# Patient Record
Sex: Female | Born: 1955 | Race: White | Hispanic: No | Smoking: Never smoker
Health system: Southern US, Community
[De-identification: ages and names within clinical notes are randomized; demographics above are authoritative.]

## PROBLEM LIST (undated history)

## (undated) DIAGNOSIS — I10 Essential (primary) hypertension: Secondary | ICD-10-CM

## (undated) DIAGNOSIS — F209 Schizophrenia, unspecified: Secondary | ICD-10-CM

## (undated) DIAGNOSIS — K219 Gastro-esophageal reflux disease without esophagitis: Secondary | ICD-10-CM

---

## 2000-07-05 HISTORY — PX: BREAST BIOPSY: SHX20

## 2004-08-17 ENCOUNTER — Ambulatory Visit: Payer: Self-pay | Admitting: Internal Medicine

## 2004-10-23 ENCOUNTER — Ambulatory Visit: Payer: Self-pay | Admitting: Gastroenterology

## 2005-08-31 ENCOUNTER — Ambulatory Visit: Payer: Self-pay | Admitting: Internal Medicine

## 2006-09-05 ENCOUNTER — Ambulatory Visit: Payer: Self-pay | Admitting: Internal Medicine

## 2007-05-22 ENCOUNTER — Ambulatory Visit: Payer: Self-pay | Admitting: Physician Assistant

## 2007-05-23 ENCOUNTER — Inpatient Hospital Stay: Payer: Self-pay | Admitting: Gastroenterology

## 2007-10-20 ENCOUNTER — Ambulatory Visit: Payer: Self-pay | Admitting: Internal Medicine

## 2008-10-22 ENCOUNTER — Ambulatory Visit: Payer: Self-pay | Admitting: Internal Medicine

## 2009-11-12 ENCOUNTER — Ambulatory Visit: Payer: Self-pay | Admitting: Internal Medicine

## 2010-11-17 ENCOUNTER — Ambulatory Visit: Payer: Self-pay | Admitting: Internal Medicine

## 2011-03-23 ENCOUNTER — Ambulatory Visit: Payer: Self-pay | Admitting: Internal Medicine

## 2011-11-23 ENCOUNTER — Ambulatory Visit: Payer: Self-pay | Admitting: Internal Medicine

## 2012-11-23 ENCOUNTER — Ambulatory Visit: Payer: Self-pay | Admitting: Internal Medicine

## 2013-12-21 ENCOUNTER — Ambulatory Visit: Payer: Self-pay | Admitting: Internal Medicine

## 2014-10-18 ENCOUNTER — Other Ambulatory Visit: Payer: Self-pay | Admitting: Internal Medicine

## 2014-10-18 DIAGNOSIS — Z1231 Encounter for screening mammogram for malignant neoplasm of breast: Secondary | ICD-10-CM

## 2014-12-31 ENCOUNTER — Ambulatory Visit: Payer: Self-pay

## 2015-01-13 ENCOUNTER — Other Ambulatory Visit: Payer: Self-pay | Admitting: Physician Assistant

## 2015-01-13 ENCOUNTER — Ambulatory Visit
Admission: RE | Admit: 2015-01-13 | Discharge: 2015-01-13 | Disposition: A | Discharge status: Home / Self Care | Payer: Managed Care, Other (non HMO) | Source: Ambulatory Visit | Attending: Physician Assistant | Admitting: Physician Assistant

## 2015-01-13 DIAGNOSIS — R4781 Slurred speech: Secondary | ICD-10-CM

## 2015-01-13 DIAGNOSIS — R413 Other amnesia: Secondary | ICD-10-CM

## 2015-01-30 ENCOUNTER — Ambulatory Visit
Admission: RE | Admit: 2015-01-30 | Discharge: 2015-01-30 | Disposition: A | Discharge status: Home / Self Care | Payer: Managed Care, Other (non HMO) | Source: Ambulatory Visit | Attending: Internal Medicine | Admitting: Internal Medicine

## 2015-01-30 DIAGNOSIS — Z1231 Encounter for screening mammogram for malignant neoplasm of breast: Secondary | ICD-10-CM | POA: Diagnosis not present

## 2015-02-20 DIAGNOSIS — Z1211 Encounter for screening for malignant neoplasm of colon: Secondary | ICD-10-CM | POA: Diagnosis not present

## 2015-02-20 DIAGNOSIS — F209 Schizophrenia, unspecified: Secondary | ICD-10-CM | POA: Diagnosis not present

## 2015-02-20 DIAGNOSIS — I10 Essential (primary) hypertension: Secondary | ICD-10-CM | POA: Diagnosis not present

## 2015-02-20 DIAGNOSIS — D124 Benign neoplasm of descending colon: Secondary | ICD-10-CM | POA: Diagnosis not present

## 2015-02-20 DIAGNOSIS — Z79899 Other long term (current) drug therapy: Secondary | ICD-10-CM | POA: Diagnosis not present

## 2015-02-20 DIAGNOSIS — K219 Gastro-esophageal reflux disease without esophagitis: Secondary | ICD-10-CM | POA: Diagnosis not present

## 2015-02-20 DIAGNOSIS — Z6833 Body mass index (BMI) 33.0-33.9, adult: Secondary | ICD-10-CM | POA: Diagnosis not present

## 2015-02-20 DIAGNOSIS — E669 Obesity, unspecified: Secondary | ICD-10-CM | POA: Diagnosis not present

## 2015-02-20 NOTE — Anesthesia Preprocedure Evaluation (Addendum)
Anesthesia Evaluation  Patient identified by MRN, date of birth, ID band Patient awake    Reviewed: Allergy & Precautions, H&P , NPO status , Patient's Chart, lab work & pertinent test results  History of Anesthesia Complications (+) DIFFICULT AIRWAY  Airway Mallampati: II  TM Distance: >3 FB Neck ROM: full    Dental no notable dental hx. (+) Teeth Intact   Pulmonary neg pulmonary ROS,  breath sounds clear to auscultation  Pulmonary exam normal       Cardiovascular hypertension, negative cardio ROS Normal cardiovascular examRhythm:regular Rate:Normal     Neuro/Psych negative neurological ROS  negative psych ROS   GI/Hepatic negative GI ROS, Neg liver ROS, GERD-  Controlled,  Endo/Other  negative endocrine ROS  Renal/GU negative Renal ROS  negative genitourinary   Musculoskeletal negative musculoskeletal ROS (+)   Abdominal Normal abdominal exam  (+) + obese,   Peds negative pediatric ROS (+)  Hematology negative hematology ROS (+)   Anesthesia Other Findings No past medical history on file.   Reproductive/Obstetrics negative OB ROS                            Anesthesia Physical Anesthesia Plan  ASA: II  Anesthesia Plan: General   Post-op Pain Management:    Induction: Intravenous  Airway Management Planned: Nasal Cannula  Additional Equipment:   Intra-op Plan:   Post-operative Plan:   Informed Consent: I have reviewed the patients History and Physical, chart, labs and discussed the procedure including the risks, benefits and alternatives for the proposed anesthesia with the patient or authorized representative who has indicated his/her understanding and acceptance.   Dental Advisory Given  Plan Discussed with: Anesthesiologist, CRNA and Surgeon  Anesthesia Plan Comments:        Anesthesia Quick Evaluation

## 2015-02-21 ENCOUNTER — Ambulatory Visit: Payer: Managed Care, Other (non HMO) | Admitting: Anesthesiology

## 2015-02-21 ENCOUNTER — Encounter
Admission: RE | Disposition: A | Discharge status: Home / Self Care | Payer: Self-pay | Source: Ambulatory Visit | Attending: Gastroenterology

## 2015-02-21 ENCOUNTER — Ambulatory Visit
Admission: RE | Admit: 2015-02-21 | Discharge: 2015-02-21 | Disposition: A | Discharge status: Home / Self Care | Payer: Managed Care, Other (non HMO) | Source: Ambulatory Visit | Attending: Gastroenterology | Admitting: Gastroenterology

## 2015-02-21 ENCOUNTER — Encounter: Payer: Self-pay | Admitting: *Deleted

## 2015-02-21 DIAGNOSIS — E669 Obesity, unspecified: Secondary | ICD-10-CM | POA: Insufficient documentation

## 2015-02-21 DIAGNOSIS — D124 Benign neoplasm of descending colon: Secondary | ICD-10-CM | POA: Insufficient documentation

## 2015-02-21 DIAGNOSIS — Z6833 Body mass index (BMI) 33.0-33.9, adult: Secondary | ICD-10-CM | POA: Insufficient documentation

## 2015-02-21 DIAGNOSIS — Z1211 Encounter for screening for malignant neoplasm of colon: Secondary | ICD-10-CM | POA: Diagnosis not present

## 2015-02-21 DIAGNOSIS — Z79899 Other long term (current) drug therapy: Secondary | ICD-10-CM | POA: Insufficient documentation

## 2015-02-21 DIAGNOSIS — I10 Essential (primary) hypertension: Secondary | ICD-10-CM | POA: Insufficient documentation

## 2015-02-21 DIAGNOSIS — K219 Gastro-esophageal reflux disease without esophagitis: Secondary | ICD-10-CM | POA: Insufficient documentation

## 2015-02-21 DIAGNOSIS — F209 Schizophrenia, unspecified: Secondary | ICD-10-CM | POA: Insufficient documentation

## 2015-02-21 HISTORY — PX: COLONOSCOPY: SHX5424

## 2015-02-21 HISTORY — DX: Schizophrenia, unspecified: F20.9

## 2015-02-21 HISTORY — DX: Essential (primary) hypertension: I10

## 2015-02-21 SURGERY — COLONOSCOPY
Anesthesia: General

## 2015-02-21 MED ORDER — SODIUM CHLORIDE 0.9 % IV SOLN: INTRAVENOUS | Status: DC

## 2015-02-21 MED ORDER — MIDAZOLAM HCL 2 MG/2ML IJ SOLN
INTRAMUSCULAR | Status: DC | PRN
  Administered 2015-02-21: 1 mg via INTRAVENOUS

## 2015-02-21 MED ORDER — FENTANYL CITRATE (PF) 100 MCG/2ML IJ SOLN
INTRAMUSCULAR | Status: DC | PRN
  Administered 2015-02-21: 50 ug via INTRAVENOUS

## 2015-02-21 MED ORDER — PROPOFOL INFUSION 10 MG/ML OPTIME
INTRAVENOUS | Status: DC | PRN
  Administered 2015-02-21: 140 ug/kg/min via INTRAVENOUS

## 2015-02-21 MED ORDER — SODIUM CHLORIDE 0.9 % IV SOLN
INTRAVENOUS | Status: DC
  Administered 2015-02-21: 1000 mL via INTRAVENOUS

## 2015-02-21 MED ORDER — METOPROLOL SUCCINATE ER 50 MG PO TB24
50.0000 mg | ORAL_TABLET | Freq: Once | ORAL | Status: AC
  Administered 2015-02-21: 50 mg via ORAL
  Filled 2015-02-21: qty 1

## 2015-02-21 NOTE — H&P (Signed)
  Primary Care Physician:  Tracie Harrier, MD  Pre-Procedure History & Physical: HPI:  Paula Reid is a 59 y.o. female is here for an colonoscopy.   Past Medical History  Diagnosis Date  . Hypertension   . Schizophrenia     Past Surgical History  Procedure Laterality Date  . Breast biopsy Left 2002    Negative c Clip    Prior to Admission medications   Medication Sig Start Date End Date Taking? Authorizing Provider  benztropine (COGENTIN) 0.5 MG tablet Take 0.5 mg by mouth 2 (two) times daily.   Yes Historical Provider, MD  cloZAPine (CLOZARIL) 100 MG tablet Take 100 mg by mouth daily.   Yes Historical Provider, MD  ergocalciferol (VITAMIN D2) 50000 UNITS capsule Take 50,000 Units by mouth once a week.   Yes Historical Provider, MD  glycopyrrolate (ROBINUL) 2 MG tablet Take 2 mg by mouth 3 (three) times daily.   Yes Historical Provider, MD  metoprolol succinate (TOPROL-XL) 50 MG 24 hr tablet Take 50 mg by mouth daily. Take with or immediately following a meal.   Yes Historical Provider, MD  omeprazole (PRILOSEC) 20 MG capsule Take 20 mg by mouth daily.   Yes Historical Provider, MD  vitamin B-12 (CYANOCOBALAMIN) 500 MCG tablet Take 500 mcg by mouth daily.   Yes Historical Provider, MD    Allergies as of 02/20/2015 - never reviewed  Allergen Reaction Noted  . Codeine  02/20/2015    Family History  Problem Relation Age of Onset  . Breast cancer Sister   . Breast cancer Paternal Grandmother     Social History   Social History  . Marital Status: Single    Spouse Name: N/A  . Number of Children: N/A  . Years of Education: N/A   Occupational History  . Not on file.   Social History Main Topics  . Smoking status: Not on file  . Smokeless tobacco: Not on file  . Alcohol Use: Not on file  . Drug Use: Not on file  . Sexual Activity: Not on file   Other Topics Concern  . Not on file   Social History Narrative     Physical Exam: BP 149/80 mmHg  Pulse 112   Temp(Src) 96.5 F (35.8 C) (Tympanic)  Resp 24  Ht 5\' 6"  (1.676 m)  Wt 95.255 kg (210 lb)  BMI 33.91 kg/m2  SpO2 98% General:   Alert,  pleasant and cooperative in NAD Head:  Normocephalic and atraumatic. Neck:  Supple; no masses or thyromegaly. Lungs:  Clear throughout to auscultation.    Heart:  Regular rate and rhythm. Abdomen:  Soft, nontender and nondistended. Normal bowel sounds, without guarding, and without rebound.   Neurologic:  Alert and  oriented x4;  grossly normal neurologically.  Impression/Plan: Paula Reid is here for an colonoscopy to be performed for screening  Risks, benefits, limitations, and alternatives regarding  colonoscopy have been reviewed with the patient.  Questions have been answered.  All parties agreeable.   Josefine Class, MD  02/21/2015, 11:52 AM

## 2015-02-21 NOTE — Op Note (Signed)
[facility] Patient Name: Paula Reid Procedure Date: 02/21/2015 11:56 AM MRN: [id number] Account #: [id number] Date of Birth: January 04, 1956 Admit Type: Outpatient Age: 59 Room: [facility] ENDO ROOM 3 Gender: Female Note Status: Finalized Procedure:         Colonoscopy Indications:       Screening for colorectal malignant neoplasm, Last                     colonoscopy 10 years ago Patient Profile:   This is a 59 year old female. Providers:         Gerrit Heck. Rayann Heman, MD Referring MD:      Tracie Harrier, MD (Referring MD) Medicines:         Propofol per Anesthesia Complications:     No immediate complications. Procedure:         Pre-Anesthesia Assessment:                    - Prior to the procedure, a History and Physical was                     performed, and patient medications, allergies and                     sensitivities were reviewed. The patient's tolerance of                     previous anesthesia was reviewed.                    After obtaining informed consent, the colonoscope was                     passed under direct vision. Throughout the procedure, the                     patient's blood pressure, pulse, and oxygen saturations                     were monitored continuously. The Colonoscope was                     introduced through the anus and advanced to the the cecum,                     identified by appendiceal orifice and ileocecal valve. The                     colonoscopy was performed without difficulty. The patient                     tolerated the procedure well. The quality of the bowel                     preparation was excellent. Findings:      The perianal and digital rectal examinations were normal.      A 4 mm polyp was found in the descending colon. The polyp was sessile.       The polyp was removed with a cold snare. Resection and retrieval were       complete.      The exam was otherwise without abnormality on  direct and retroflexion       views. Impression:        - One 4 mm polyp in the descending colon.  Resected and                     retrieved.                    - The examination was otherwise normal on direct and                     retroflexion views. Recommendation:    - Observe patient in GI recovery unit.                    - High fiber diet.                    - Continue present medications.                    - Await pathology results.                    - Repeat colonoscopy for surveillance based on pathology                     results.                    - Return to referring physician.                    - The findings and recommendations were discussed with the                     patient.                    - The findings and recommendations were discussed with the                     patient's family. Procedure Code(s): --- Professional ---                    [phone], Colonoscopy, flexible; with removal of tumor(s),                     polyp(s), or other lesion(s) by snare technique CPT copyright 2014 [employer]. All rights reserved. The codes documented in this report are preliminary and upon coder review may  be revised to meet current compliance requirements. Mellody Life, MD 02/21/2015 12:26:56 PM This report has been signed electronically. Number of Addenda: 0 Note Initiated On: 02/21/2015 11:56 AM Scope Withdrawal Time: 0 hours 12 minutes 53 seconds  Total Procedure Duration: 0 hours 18 minutes 24 seconds       [facility]

## 2015-02-21 NOTE — Discharge Instructions (Signed)

## 2015-02-21 NOTE — Anesthesia Postprocedure Evaluation (Signed)
  Anesthesia Post-op Note  Patient: Paula Reid  Procedure(s) Performed: Procedure(s): COLONOSCOPY (N/A)  Anesthesia type:General  Patient location: PACU  Post pain: Pain level controlled  Post assessment: Post-op Vital signs reviewed, Patient's Cardiovascular Status Stable, Respiratory Function Stable, Patent Airway and No signs of Nausea or vomiting  Post vital signs: Reviewed and stable  Last Vitals:  Filed Vitals:   02/21/15 1233  BP: 108/76  Pulse: 79  Temp: 35.1 C  Resp: 16    Level of consciousness: awake, alert  and patient cooperative  Complications: No apparent anesthesia complications

## 2015-02-21 NOTE — Transfer of Care (Signed)
Immediate Anesthesia Transfer of Care Note  Patient: Paula Reid  Procedure(s) Performed: Procedure(s): COLONOSCOPY (N/A)  Patient Location: PACU and Endoscopy Unit  Anesthesia Type:General  Level of Consciousness: awake, alert  and oriented  Airway & Oxygen Therapy: Patient Spontanous Breathing  Post-op Assessment: Report given to RN and Post -op Vital signs reviewed and stable  Post vital signs: stable  Last Vitals:  Filed Vitals:   02/21/15 1233  BP: 108/76  Pulse: 79  Temp: 35.1 C  Resp: 16    Complications: No apparent anesthesia complications

## 2015-02-22 NOTE — Progress Notes (Signed)
Non-identifying voicemail.  No message left.

## 2015-02-24 ENCOUNTER — Encounter: Payer: Self-pay | Admitting: Gastroenterology

## 2015-02-24 LAB — SURGICAL PATHOLOGY

## 2016-01-23 ENCOUNTER — Other Ambulatory Visit: Payer: Self-pay | Admitting: Internal Medicine

## 2016-01-23 DIAGNOSIS — Z1231 Encounter for screening mammogram for malignant neoplasm of breast: Secondary | ICD-10-CM

## 2016-02-11 ENCOUNTER — Ambulatory Visit: Payer: Managed Care, Other (non HMO)

## 2016-02-20 ENCOUNTER — Ambulatory Visit: Payer: Managed Care, Other (non HMO)

## 2016-02-27 ENCOUNTER — Other Ambulatory Visit: Payer: Self-pay | Admitting: Internal Medicine

## 2016-02-27 ENCOUNTER — Ambulatory Visit
Admission: RE | Admit: 2016-02-27 | Discharge: 2016-02-27 | Disposition: A | Discharge status: Home / Self Care | Payer: Managed Care, Other (non HMO) | Source: Ambulatory Visit | Attending: Internal Medicine | Admitting: Internal Medicine

## 2016-02-27 DIAGNOSIS — R928 Other abnormal and inconclusive findings on diagnostic imaging of breast: Secondary | ICD-10-CM | POA: Diagnosis not present

## 2016-02-27 DIAGNOSIS — Z1231 Encounter for screening mammogram for malignant neoplasm of breast: Secondary | ICD-10-CM | POA: Insufficient documentation

## 2016-02-27 IMAGING — MG MM DIGITAL SCREENING BILAT W/ TOMO W/ CAD
4 series · 4 of 4 positions shown · non-contrast
Comparison: Previous exam(s).

CLINICAL DATA: Screening.

EXAM:
2D DIGITAL SCREENING BILATERAL MAMMOGRAM WITH CAD AND ADJUNCT TOMO

[L MLO (1 of 2)]
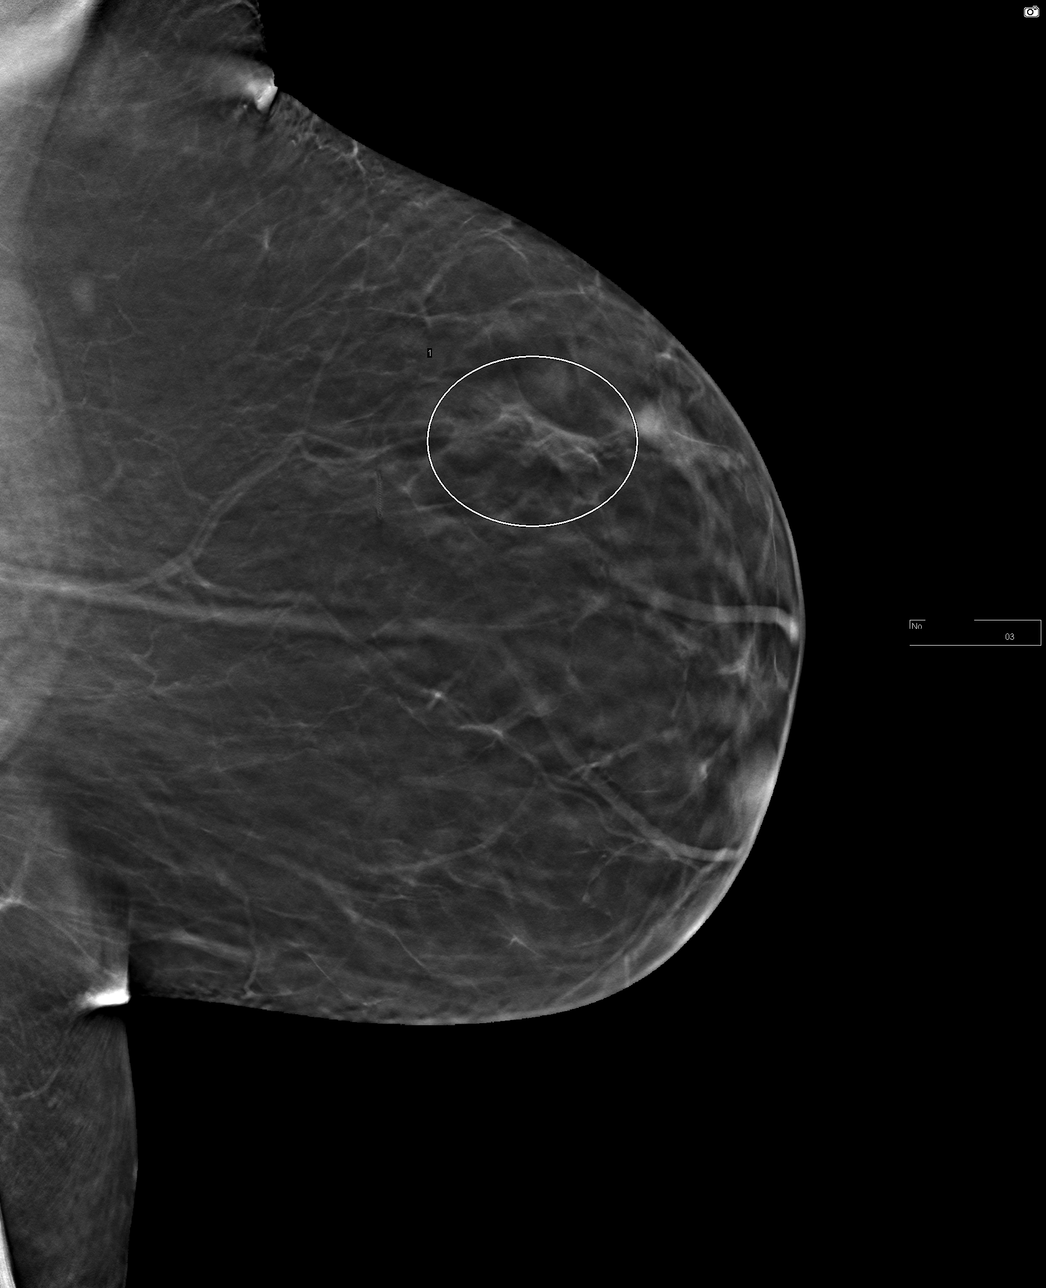

[L MLO (2 of 2)]
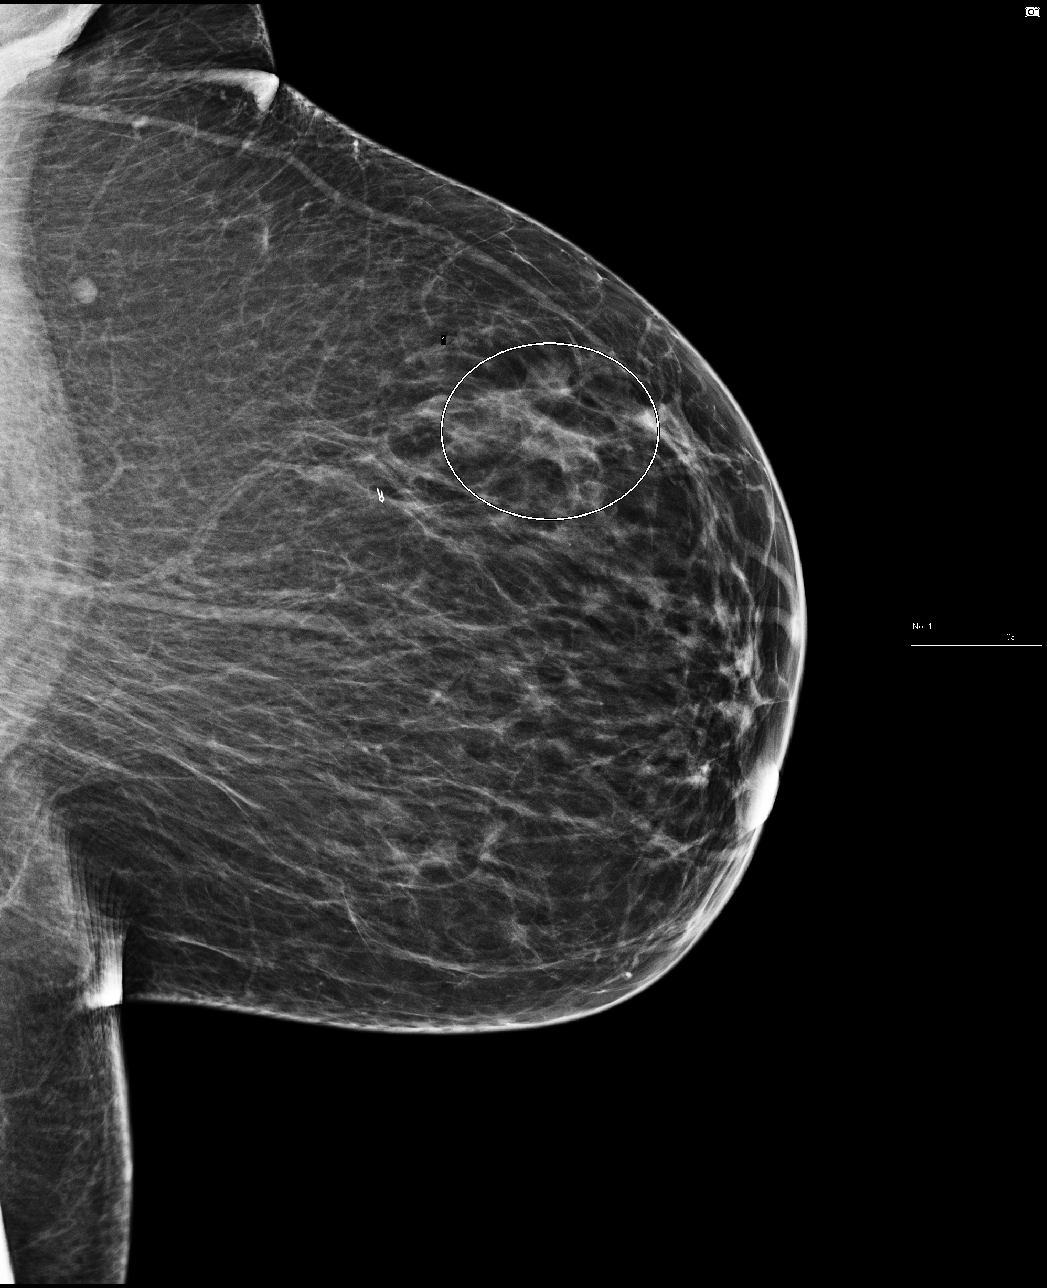

[L CC (1 of 2)]
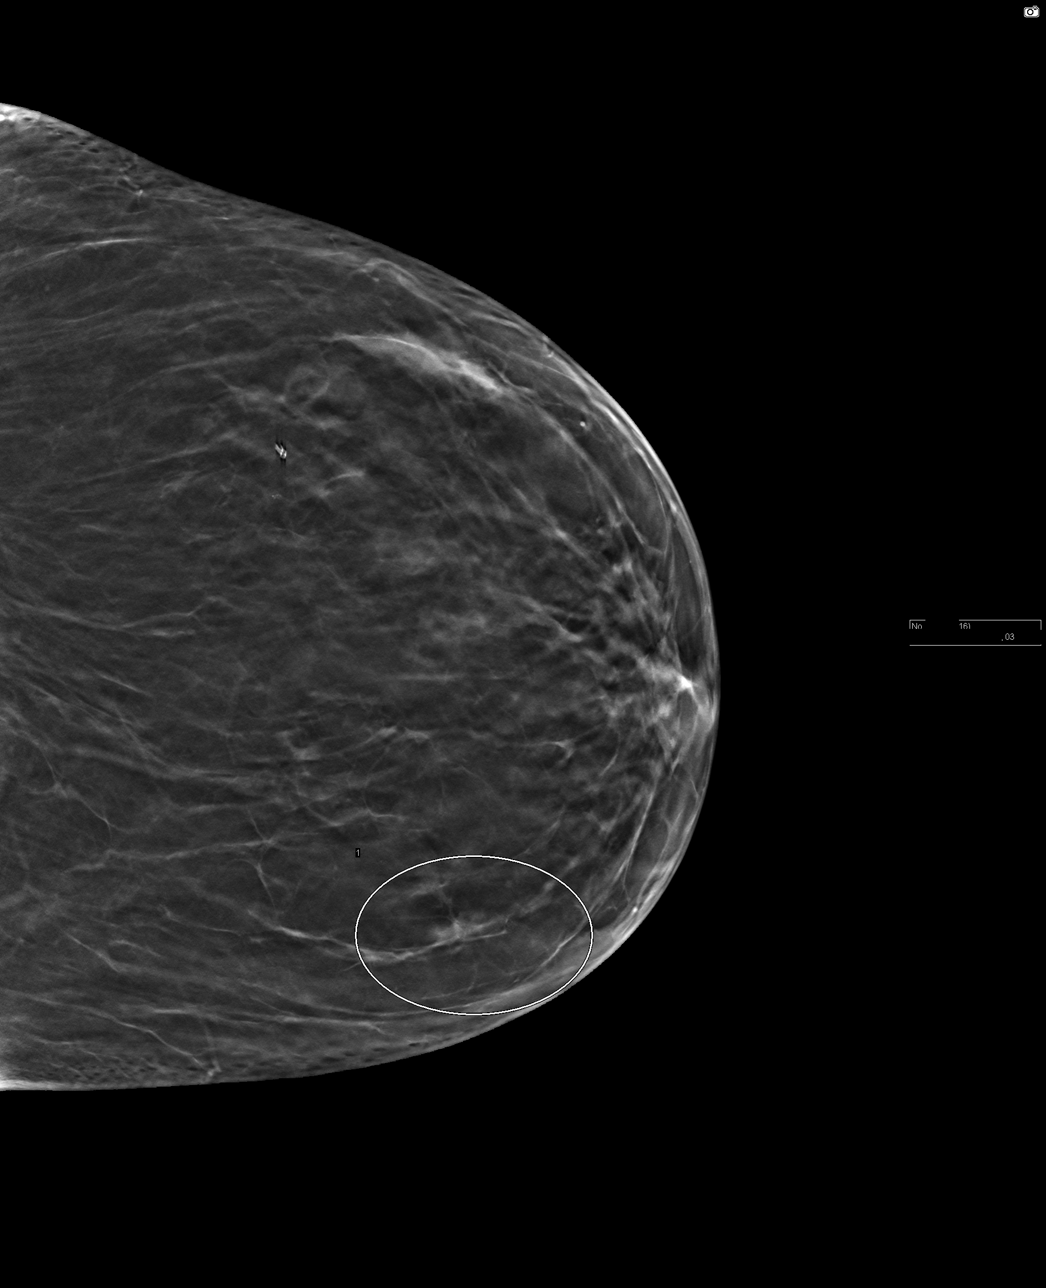

[L CC (2 of 2)]
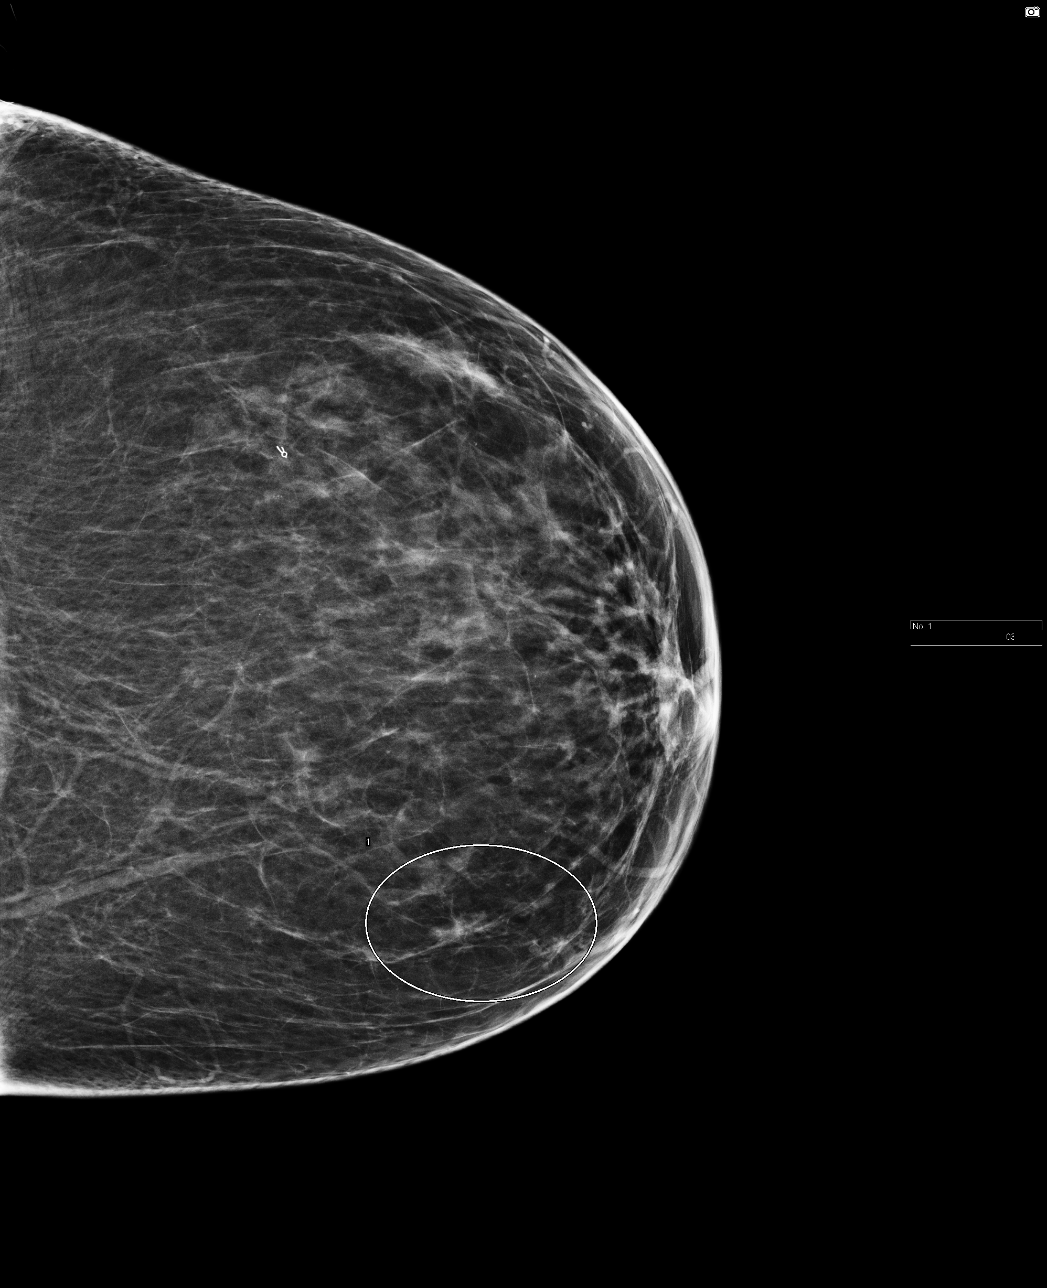

[4 of 4 positions shown; findings below may reference images not displayed]

ACR Breast Density Category b: There are scattered areas of
fibroglandular density.
FINDINGS: In the left breast, a possible mass warrants further evaluation. In
the right breast, no findings suspicious for malignancy.

Images were processed with CAD.
IMPRESSION: Further evaluation is suggested for possible mass in the left
breast.

RECOMMENDATION:
Diagnostic mammogram and possibly ultrasound of the left breast.
(Code:KJ-[id number])

The patient will be contacted regarding the findings, and additional
imaging will be scheduled.

BI-RADS CATEGORY  0: Incomplete. Need additional imaging evaluation
and/or prior mammograms for comparison.

## 2016-03-02 ENCOUNTER — Other Ambulatory Visit: Payer: Self-pay | Admitting: Internal Medicine

## 2016-03-02 DIAGNOSIS — N6320 Unspecified lump in the left breast, unspecified quadrant: Secondary | ICD-10-CM

## 2016-03-04 ENCOUNTER — Ambulatory Visit
Admission: RE | Admit: 2016-03-04 | Discharge: 2016-03-04 | Disposition: A | Discharge status: Home / Self Care | Payer: Managed Care, Other (non HMO) | Source: Ambulatory Visit | Attending: Internal Medicine | Admitting: Internal Medicine

## 2016-03-04 DIAGNOSIS — N6320 Unspecified lump in the left breast, unspecified quadrant: Secondary | ICD-10-CM

## 2016-03-04 DIAGNOSIS — N63 Unspecified lump in breast: Secondary | ICD-10-CM | POA: Diagnosis present

## 2016-03-18 ENCOUNTER — Other Ambulatory Visit: Payer: Managed Care, Other (non HMO)

## 2016-03-18 ENCOUNTER — Ambulatory Visit: Payer: Managed Care, Other (non HMO)

## 2016-07-28 ENCOUNTER — Emergency Department: Payer: Managed Care, Other (non HMO)

## 2016-07-28 ENCOUNTER — Inpatient Hospital Stay
Admission: EM | Admit: 2016-07-28 | Discharge: 2016-07-29 | DRG: 689 | Disposition: A | Discharge status: Home / Self Care | Payer: Managed Care, Other (non HMO) | Attending: Internal Medicine | Admitting: Internal Medicine

## 2016-07-28 DIAGNOSIS — K219 Gastro-esophageal reflux disease without esophagitis: Secondary | ICD-10-CM | POA: Diagnosis present

## 2016-07-28 DIAGNOSIS — I10 Essential (primary) hypertension: Secondary | ICD-10-CM | POA: Diagnosis present

## 2016-07-28 DIAGNOSIS — G9340 Encephalopathy, unspecified: Secondary | ICD-10-CM | POA: Diagnosis present

## 2016-07-28 DIAGNOSIS — R4701 Aphasia: Secondary | ICD-10-CM | POA: Diagnosis present

## 2016-07-28 DIAGNOSIS — I639 Cerebral infarction, unspecified: Secondary | ICD-10-CM

## 2016-07-28 DIAGNOSIS — N3000 Acute cystitis without hematuria: Secondary | ICD-10-CM | POA: Diagnosis present

## 2016-07-28 DIAGNOSIS — N390 Urinary tract infection, site not specified: Secondary | ICD-10-CM | POA: Diagnosis present

## 2016-07-28 DIAGNOSIS — Z79899 Other long term (current) drug therapy: Secondary | ICD-10-CM | POA: Diagnosis not present

## 2016-07-28 DIAGNOSIS — R410 Disorientation, unspecified: Secondary | ICD-10-CM | POA: Diagnosis present

## 2016-07-28 DIAGNOSIS — F200 Paranoid schizophrenia: Secondary | ICD-10-CM | POA: Diagnosis present

## 2016-07-28 DIAGNOSIS — F209 Schizophrenia, unspecified: Secondary | ICD-10-CM | POA: Diagnosis present

## 2016-07-28 DIAGNOSIS — F17200 Nicotine dependence, unspecified, uncomplicated: Secondary | ICD-10-CM | POA: Diagnosis present

## 2016-07-28 HISTORY — DX: Gastro-esophageal reflux disease without esophagitis: K21.9

## 2016-07-28 LAB — CBC
HEMATOCRIT: 38.8 % (ref 35.0–47.0)
HEMOGLOBIN: 13.8 g/dL (ref 12.0–16.0)
MCH: 29.9 pg (ref 26.0–34.0)
MCHC: 35.6 g/dL (ref 32.0–36.0)
MCV: 84.2 fL (ref 80.0–100.0)
Platelets: 171 10*3/uL (ref 150–440)
RBC: 4.61 MIL/uL (ref 3.80–5.20)
RDW: 13.5 % (ref 11.5–14.5)
WBC: 7 10*3/uL (ref 3.6–11.0)

## 2016-07-28 LAB — URINALYSIS, COMPLETE (UACMP) WITH MICROSCOPIC
Bilirubin Urine: NEGATIVE
GLUCOSE, UA: NEGATIVE mg/dL
HGB URINE DIPSTICK: NEGATIVE
Ketones, ur: 20 mg/dL — AB
NITRITE: NEGATIVE
PH: 5 (ref 5.0–8.0)
Specific Gravity, Urine: 1.019 (ref 1.005–1.030)

## 2016-07-28 LAB — ETHANOL: Alcohol, Ethyl (B): <5 mg/dL (ref <5)

## 2016-07-28 LAB — COMPREHENSIVE METABOLIC PANEL
ALBUMIN: 4.2 g/dL (ref 3.5–5.0)
ALK PHOS: 46 U/L (ref 38–126)
ALT: 14 U/L (ref 14–54)
ANION GAP: 9 (ref 5–15)
AST: 21 U/L (ref 15–41)
BUN: 16 mg/dL (ref 6–20)
CALCIUM: 8.9 mg/dL (ref 8.9–10.3)
CHLORIDE: 105 mmol/L (ref 101–111)
CO2: 23 mmol/L (ref 22–32)
Creatinine, Ser: 0.83 mg/dL (ref 0.44–1.00)
GFR calc non Af Amer: >60 mL/min (ref >60)
GLUCOSE: 83 mg/dL (ref 65–99)
POTASSIUM: 4.1 mmol/L (ref 3.5–5.1)
SODIUM: 137 mmol/L (ref 135–145)
Total Bilirubin: 1.1 mg/dL (ref 0.3–1.2)
Total Protein: 6.8 g/dL (ref 6.5–8.1)

## 2016-07-28 LAB — GLUCOSE, CAPILLARY: GLUCOSE-CAPILLARY: 78 mg/dL (ref 65–99)

## 2016-07-28 LAB — TSH: TSH: 1.731 u[IU]/mL (ref 0.350–4.500)

## 2016-07-28 MED ORDER — CEFTRIAXONE SODIUM-DEXTROSE 1-3.74 GM-% IV SOLR
1.0000 g | Freq: Once | INTRAVENOUS | Status: AC
  Administered 2016-07-29: 03:00:00 1 g via INTRAVENOUS
  Filled 2016-07-28: qty 50

## 2016-07-28 MED ORDER — CEPHALEXIN 500 MG PO CAPS: 500.0000 mg | ORAL_CAPSULE | Freq: Four times a day (QID) | ORAL | 0 refills | Status: DC

## 2016-07-28 MED ORDER — DEXTROSE 5 % IV SOLN: 1.0000 g | Freq: Once | INTRAVENOUS | Status: DC

## 2016-07-28 NOTE — ED Notes (Signed)
Pt taken to CT via stretcher.

## 2016-07-28 NOTE — Discharge Instructions (Signed)
Suspect that sure confusion today is related to your psychiatric medications. As we discussed with your psychiatrist, please stop use of benztropine and clozaril until you have been reevaluated by her psychiatrist on Friday.  Please return to the emergency room if the patient begins to have a fever, trouble breathing or chest pain, weakness, nausea and vomiting, worsening confusion, hallucinations, or other new concerns arise.

## 2016-07-28 NOTE — H&P (Signed)
Paula Reid at [place] NAME: Paula Reid    MR#:  [id number]  DATE OF BIRTH:  [birth year]  DATE OF ADMISSION:  07/28/2016  PRIMARY CARE PHYSICIAN: Tracie Harrier, MD   REQUESTING/REFERRING PHYSICIAN: Mariea Clonts, MD  CHIEF COMPLAINT:   Chief Complaint  Patient presents with  . Altered Mental Status    HISTORY OF PRESENT ILLNESS:  Paula Reid  is a 61 y.o. female who presents with Several days of progressive confusion. Patient is a paranoid schizophrenic, who has been stable and high functioning on medication for a long time. She was having episodes of confusion at work for the past couple of days, and today did not show up for work. Her supervisor called her brother, who had a conversation with her primary psychiatrist. Her psychiatrist spoke with the patient over the phone, and then recommended she be brought to the ED for evaluation. There is some suspicion of potential medication confusion given that when her brother went to pick her up from her house her medications were laid out, but not in an orderly manner as they usually are. Here the patient was found to also have a UTI. Hospitals were called for admission  PAST MEDICAL HISTORY:   Past Medical History:  Diagnosis Date  . GERD (gastroesophageal reflux disease)   . Hypertension   . Schizophrenia ([place])     PAST SURGICAL HISTORY:   Past Surgical History:  Procedure Laterality Date  . BREAST BIOPSY Left 2002   Negative c Clip  . COLONOSCOPY N/A 02/21/2015   Procedure: COLONOSCOPY;  Surgeon: Josefine Class, MD;  Location: [facility] ENDOSCOPY;  Service: Endoscopy;  Laterality: N/A;    SOCIAL HISTORY:   Social History  Substance Use Topics  . Smoking status: Current Every Day Smoker  . Smokeless tobacco: Never Used  . Alcohol use Yes    FAMILY HISTORY:   Family History  Problem Relation Age of Onset  . Breast cancer Sister 72  . Breast cancer Paternal Grandmother      DRUG ALLERGIES:   Allergies  Allergen Reactions  . Codeine     MEDICATIONS AT HOME:   Prior to Admission medications   Medication Sig Start Date End Date Taking? Authorizing Provider  benztropine (COGENTIN) 0.5 MG tablet Take 0.5 mg by mouth 3 (three) times daily as needed.    Yes Historical Provider, MD  cloZAPine (CLOZARIL) 25 MG tablet Take 525 mg by mouth at bedtime.    Yes Historical Provider, MD  glycopyrrolate (ROBINUL) 2 MG tablet Take 8 mg by mouth at bedtime.    Yes Historical Provider, MD  metoprolol succinate (TOPROL-XL) 25 MG 24 hr tablet Take 25 mg by mouth daily. Take with or immediately following a meal.    Yes Historical Provider, MD  pyridOXINE (VITAMIN B-6) 100 MG tablet Take 100 mg by mouth daily.   Yes Historical Provider, MD  cephALEXin (KEFLEX) 500 MG capsule Take 1 capsule (500 mg total) by mouth 4 (four) times daily. 07/28/16 08/07/16  Eula Listen, MD  ergocalciferol (VITAMIN D2) 50000 UNITS capsule Take 50,000 Units by mouth once a week.    Historical Provider, MD  omeprazole (PRILOSEC) 20 MG capsule Take 20 mg by mouth daily.    Historical Provider, MD    REVIEW OF SYSTEMS:  Review of Systems  Constitutional: Negative for chills, fever, malaise/fatigue and weight loss.  HENT: Negative for ear pain, hearing loss and tinnitus.   Eyes: Negative for blurred vision, double  vision, pain and redness.  Respiratory: Negative for cough, hemoptysis and shortness of breath.   Cardiovascular: Negative for chest pain, palpitations, orthopnea and leg swelling.  Gastrointestinal: Negative for abdominal pain, constipation, diarrhea, nausea and vomiting.  Genitourinary: Positive for frequency. Negative for dysuria and hematuria.  Musculoskeletal: Negative for back pain, joint pain and neck pain.  Skin:       No acne, rash, or lesions  Neurological: Negative for dizziness, tremors, focal weakness and weakness.       Confusion  Endo/Heme/Allergies: Negative for  polydipsia. Does not bruise/bleed easily.  Psychiatric/Behavioral: Negative for depression. The patient is not nervous/anxious and does not have insomnia.      VITAL SIGNS:   Vitals:   07/28/16 2000 07/28/16 2030 07/28/16 2100 07/28/16 2243  BP: 133/80 (!) 141/85 140/81 122/76  Pulse: 79 80 87 78  Resp: 16 (!) 23 16 20   Temp:      TempSrc:      SpO2: 97% 97% 96% 97%  Weight:       Wt Readings from Last 3 Encounters:  07/28/16 95.3 kg (210 lb)  02/21/15 95.3 kg (210 lb)    PHYSICAL EXAMINATION:  Physical Exam  Vitals reviewed. Constitutional: She appears well-developed and well-nourished. No distress.  HENT:  Head: Normocephalic and atraumatic.  Mouth/Throat: Oropharynx is clear and moist.  Eyes: Conjunctivae and EOM are normal. Pupils are equal, round, and reactive to light. No scleral icterus.  Neck: Normal range of motion. Neck supple. No JVD present. No thyromegaly present.  Cardiovascular: Normal rate, regular rhythm and intact distal pulses.  Exam reveals no gallop and no friction rub.   No murmur heard. Respiratory: Effort normal and breath sounds normal. No respiratory distress. She has no wheezes. She has no rales.  GI: Soft. Bowel sounds are normal. She exhibits no distension. There is no tenderness.  Musculoskeletal: Normal range of motion. She exhibits no edema.  No arthritis, no gout  Lymphadenopathy:    She has no cervical adenopathy.  Neurological: She is alert. No cranial nerve deficit.  No dysarthria, no aphasia  Skin: Skin is warm and dry. No rash noted. No erythema.  Psychiatric:  Unable to fully assess due to patient condition    LABORATORY PANEL:   CBC  Recent Labs Lab 07/28/16 1527  WBC 7.0  HGB 13.8  HCT 38.8  PLT 171   ------------------------------------------------------------------------------------------------------------------  Chemistries   Recent Labs Lab 07/28/16 1527  NA 137  K 4.1  CL 105  CO2 23  GLUCOSE 83  BUN 16   CREATININE 0.83  CALCIUM 8.9  AST 21  ALT 14  ALKPHOS 46  BILITOT 1.1   ------------------------------------------------------------------------------------------------------------------  Cardiac Enzymes No results for input(s): TROPONINI in the last 168 hours. ------------------------------------------------------------------------------------------------------------------  RADIOLOGY:  Ct Head Wo Contrast  Result Date: 07/28/2016 CLINICAL DATA:  Increased confusion in the past 2 days. EXAM: CT HEAD WITHOUT CONTRAST TECHNIQUE: Contiguous axial images were obtained from the base of the skull through the vertex without intravenous contrast. COMPARISON:  01/13/2015 FINDINGS: Brain: There is no evidence of acute cortical infarct, intracranial hemorrhage, mass, midline shift, or extra-axial fluid collection. The ventricles and sulci are normal for age. Vascular: No hyperdense vessel or unexpected calcification. Skull: No fracture or focal osseous lesion. Sinuses/Orbits: No significant sinus disease.  Unremarkable orbits. Other: None. IMPRESSION: Unremarkable head CT for age. Electronically Signed   By: Logan Bores M.D.   On: 07/28/2016 19:05    EKG:   Orders placed or  performed during the hospital encounter of 07/28/16  . ED EKG  . ED EKG  . EKG 12-Lead  . EKG 12-Lead    IMPRESSION AND PLAN:  Principal Problem:   UTI (urinary tract infection) - IV antibiotics started in the ED and continued on admission. Culture sent from the ED Active Problems:   Acute encephalopathy - suspect this is due to her UTI, however could potentially be due to something like a Clozaril toxicity. We will hold her Clozaril tonight, continue her Cogentin. We will get a psychiatry consult   Schizophrenia ([place]) - medication changes as above, psych consult as above   HTN (hypertension) - continue home meds   GERD (gastroesophageal reflux disease) - home dose PPI  All the records are reviewed and case discussed  with ED provider. Management plans discussed with the patient and/or family.  DVT PROPHYLAXIS: SubQ lovenox  GI PROPHYLAXIS: PPI  ADMISSION STATUS: Inpatient  CODE STATUS: Full Code Status History    This patient does not have a recorded code status. Please follow your organizational policy for patients in this situation.      TOTAL TIME TAKING CARE OF THIS PATIENT: 45 minutes.    Josep Luviano Wolverine 07/28/2016, 11:23 PM  [facility]  Office  [phone]  CC: Primary care physician; Tracie Harrier, MD

## 2016-07-28 NOTE — ED Notes (Signed)
Pt still unable to urinate at this time, will keep checking on pt to see if she can provide urine sample.

## 2016-07-28 NOTE — ED Notes (Addendum)
[facility] [phone]  Spoke to [place]. They recommend watching for 4 hours for any improvement or worsening. They recommend looking at medical causes as well for symptoms. They stated they would expect vital signs changes. If VS and no worsening condition, they can go.

## 2016-07-28 NOTE — ED Provider Notes (Signed)
As patient was signed out to me by Dr. Jacqualine Code. 61 y.o. female with a history of schizophrenia brought in for altered mental status and has not been oriented. She is also having some speech difficulties which are not usual for her. At this time, the patient does have a urinalysis that is consistent with UTI, but her symptoms have not improved and there are still some outstanding questions about whether some of her medications may be causing the symptoms. I'll plan to have her a gram of Rocephin, and admit her to the hospital for further treatment and evaluation.   Eula Listen, MD 07/28/16 2312

## 2016-07-28 NOTE — ED Provider Notes (Signed)
[facility] Emergency Department Provider Note ____________________________________________   First MD Initiated Contact with Patient 07/28/16 1758     (approximate)  I have reviewed the triage vital signs and the nursing notes.  HISTORY  Chief Complaint Altered Mental Status  HPI Paula Reid is a 61 y.o. female the history of schizophrenia.  Patient's brother saw her today and noted that she seemed to have trouble with her memory. She did walking normally, has not had a fever, has not been sick recently, and the patient reports that she feels well. Brother reports that she's having trouble remembering things, seems to have a lot of difficulty with memory. She has some stuttering speech but this is been normal for her for years without change. He has not noticed any weakness or face, trouble with her arms or legs.  Patient denies any concerns, but does not recall Nexium per medications. Normally she is very on point and does not have confusion with her medicine, works 60 hours a week.  Patient denies any overdose. She does use alcohol, last had 3-4 beers yesterday  Denies any hallucinations or desire to harm herself or others    Past Medical History:  Diagnosis Date  . Hypertension   . Schizophrenia ([place])     There are no active problems to display for this patient.   Past Surgical History:  Procedure Laterality Date  . BREAST BIOPSY Left 2002   Negative c Clip  . COLONOSCOPY N/A 02/21/2015   Procedure: COLONOSCOPY;  Surgeon: Josefine Class, MD;  Location: [facility] ENDOSCOPY;  Service: Endoscopy;  Laterality: N/A;    Prior to Admission medications   Medication Sig Start Date End Date Taking? Authorizing Provider  benztropine (COGENTIN) 0.5 MG tablet Take 0.5 mg by mouth 3 (three) times daily as needed.    Yes Historical Provider, MD  cloZAPine (CLOZARIL) 25 MG tablet Take 525 mg by mouth at bedtime.    Yes Historical Provider, MD    glycopyrrolate (ROBINUL) 2 MG tablet Take 8 mg by mouth at bedtime.    Yes Historical Provider, MD  metoprolol succinate (TOPROL-XL) 25 MG 24 hr tablet Take 25 mg by mouth daily. Take with or immediately following a meal.    Yes Historical Provider, MD  pyridOXINE (VITAMIN B-6) 100 MG tablet Take 100 mg by mouth daily.   Yes Historical Provider, MD  ergocalciferol (VITAMIN D2) 50000 UNITS capsule Take 50,000 Units by mouth once a week.    Historical Provider, MD  omeprazole (PRILOSEC) 20 MG capsule Take 20 mg by mouth daily.    Historical Provider, MD    Allergies Codeine  Family History  Problem Relation Age of Onset  . Breast cancer Sister 43  . Breast cancer Paternal Grandmother     Social History Social History  Substance Use Topics  . Smoking status: Current Every Day Smoker  . Smokeless tobacco: Never Used  . Alcohol use Yes    Review of Systems - taken now, though notably may not be very reliable given patient's confusion Constitutional: No fever/chills Eyes: No visual changes. ENT: No sore throat. Cardiovascular: Denies chest pain. Respiratory: Denies shortness of breath. Gastrointestinal: No abdominal pain.  No nausea, no vomiting.   Genitourinary: Negative for dysuria.no history of frequent UTIs Skin: Negative for rash. Neurological: Negative for headaches, focal weakness or numbness.  10-point ROS otherwise negative.  ____________________________________________   PHYSICAL EXAM:  VITAL SIGNS: ED Triage Vitals  Enc Vitals Group  BP 07/28/16 1524 131/71     Pulse Rate 07/28/16 1524 92     Resp 07/28/16 1524 18     Temp 07/28/16 1524 98.2 F (36.8 C)     Temp Source 07/28/16 1524 Oral     SpO2 07/28/16 1524 100 %     Weight 07/28/16 1524 210 lb (95.3 kg)     Height --      Head Circumference --      Peak Flow --      Pain Score 07/28/16 1736 0     Pain Loc --      Pain Edu? --      Excl. in [place]? --     Constitutional: Alert and orientedto self  and her brother, but disoriented to date season. Well appearing and in no acute distress. Eyes: Conjunctivae are normal. PERRL. EOMI. Head: Atraumatic. Nose: No congestion/rhinnorhea. Mouth/Throat: Mucous membranes are moist.  Oropharynx non-erythematous. Neck: No stridor.  No meningismus Cardiovascular: Normal rate, regular rhythm. Grossly normal heart sounds.  Good peripheral circulation. Respiratory: Normal respiratory effort.  No retractions. Lungs CTAB. Gastrointestinal: Soft and nontender. No distention.Musculoskeletal: No lower extremity tenderness nor edema.  No joint effusions. Neurologic:  Normal speech and language except for some slight stuttering which is reportedly not new and chronic. No gross focal neurologic deficits are appreciated.   The patient has no pronator drift. The patient has normal cranial nerve exam. Extraocular movements are normal. Visual fields are normal. Patient has 5 out of 5 strength in all extremities. There is no numbness or gross, acute sensory abnormality in the extremities bilaterally. No speech disturbance. Slight stuttering of speech, reported as normal for the patient by her and her brother. No aphasia. No ataxia. Normal finger nose finger bilat. Patient speaking in full and clear sentences.  Skin:  Skin is warm, dry and intact. No rash noted. Psychiatric: Mood and affect are calm. Speech and behavior are normal.  ____________________________________________   LABS (all labs ordered are listed, but only abnormal results are displayed)  Labs Reviewed  COMPREHENSIVE METABOLIC PANEL  CBC  GLUCOSE, CAPILLARY  ETHANOL  URINALYSIS, COMPLETE (UACMP) WITH MICROSCOPIC  TSH  CBG MONITORING, ED   ____________________________________________  EKG  EKG was obtained to evaluate for QT interval EKG time 1900 Heart rate 80 QRS 95 QTC 446 Normal sinus rhythm, normal QT QRS 90 PR segments normal Normal sinus rhythm, a right axis deviation  is noted No evidence of acute ischemic change or STEMI ____________________________________________  RADIOLOGY  Ct Head Wo Contrast  Result Date: 07/28/2016 CLINICAL DATA:  Increased confusion in the past 2 days. EXAM: CT HEAD WITHOUT CONTRAST TECHNIQUE: Contiguous axial images were obtained from the base of the skull through the vertex without intravenous contrast. COMPARISON:  01/13/2015 FINDINGS: Brain: There is no evidence of acute cortical infarct, intracranial hemorrhage, mass, midline shift, or extra-axial fluid collection. The ventricles and sulci are normal for age. Vascular: No hyperdense vessel or unexpected calcification. Skull: No fracture or focal osseous lesion. Sinuses/Orbits: No significant sinus disease.  Unremarkable orbits. Other: None. IMPRESSION: Unremarkable head CT for age. Electronically Signed   By: Logan Bores M.D.   On: 07/28/2016 19:05    ____________________________________________   PROCEDURES  Procedure(s) performed: None  Procedures  Critical Care performed: No  ____________________________________________   INITIAL IMPRESSION / ASSESSMENT AND PLAN / ED COURSE  Pertinent labs & imaging results that were available during my care of the patient were reviewed by me and considered in my  medical decision making (see chart for details).  Alteration in mental status. Normal hemodynamics, review of the patient's medications would indicate that potential offending agents include benztropine and clozaril, which are the only 2 medications that the brother reports she will have a couple days.  Differential diagnoses broad, includes metabolic, toxic, felt less likely acute neurologic the patient exhibits no obvious stroke symptoms, no cardiac or pulmonary symptoms. Afebrile denying any infectious etiology.    ----------------------------------------- 6:27 PM on 07/28/2016 ----------------------------------------- Called and spoke with the patient's  psychiatrist Dr. Esperanza Sheets. We discussed her case, clinical exam, and labs. Dr. Esperanza Sheets revise this is likely a early sign of toxicity from Clozaril, which can induce delirium. Dr. Esperanza Sheets recommends the patient stop this medication as well as benztropine and will be able to follow up with her in the clinic on Friday. She also agrees with the plan to obtain a CT of the head to evaluate for any subtle sign of a stroke, though felt less likely. In addition recommends adding an ethanol level, an EKG to evaluate QTc. Dr. Esperanza Sheets recommends discussing with poison control, before decision for disposition though she advises of present control okay with sending the patient home and she would see the clinic on Friday the patient's brother and the patient were both present during my conversation with their psychiatrist, and both are in agreement with the plan  ----------------------------------------- 8:05 PM on 07/28/2016 -----------------------------------------  Ongoing care assigned to Dr. Mariea Clonts. Awaiting urinalysis and TSH, and patient suspected of having mild change in mental status and possible slight delirium due to 2 of her medications. Observing in the ER for additional 3 hours time, if continued stable hemodynamics and only ongoing mild confusion without worsening the patient may be discharged under the care of her brother who is present, and follow-up closely with her psychiatrist careful return precautions. ____________________________________________   FINAL CLINICAL IMPRESSION(S) / ED DIAGNOSES  Final diagnoses:  Delirium      NEW MEDICATIONS STARTED DURING THIS VISIT:  New Prescriptions   No medications on file     Note:  This document was prepared using Dragon voice recognition software and may include unintentional dictation errors.     Delman Kitten, MD 07/28/16 2007

## 2016-07-28 NOTE — ED Triage Notes (Addendum)
Pt arrives to ER via POV c/o AMS. Pt has having increase in confusion for last 2 days. Pt brother unsure if patient taking medications wrong. Pt talked with psychiatrist today via phone who was concerned and sent pt to ER for possibly taking too much cogentin unintentionally.   Pt alert to self, place, situation and time but does have hard time following instructions and coming up with words.

## 2016-07-28 NOTE — ED Notes (Addendum)
Pt brother states he believes that pt has mixed up medications and is having trouble processing. Pt lying on bed in no distress. Pt answers questions appropriately, stutters. States she normally has a little bit of a stutter. Pt works at [employer] and noticed symptoms yest, called her today because pt did not show up to work. Pt brother states pt had medications jumbled at home and not in correct bottles, states pt is "unsure when she took what"

## 2016-07-28 NOTE — ED Notes (Signed)
Pt provided [place] sandwich tray and coke to drink.

## 2016-07-29 ENCOUNTER — Inpatient Hospital Stay: Payer: Managed Care, Other (non HMO)

## 2016-07-29 LAB — CBC
HEMATOCRIT: 34.7 % — AB (ref 35.0–47.0)
Hemoglobin: 12.4 g/dL (ref 12.0–16.0)
MCH: 30.2 pg (ref 26.0–34.0)
MCHC: 35.9 g/dL (ref 32.0–36.0)
MCV: 84.3 fL (ref 80.0–100.0)
PLATELETS: 150 10*3/uL (ref 150–440)
RBC: 4.12 MIL/uL (ref 3.80–5.20)
RDW: 13.5 % (ref 11.5–14.5)
WBC: 5.7 10*3/uL (ref 3.6–11.0)

## 2016-07-29 LAB — BASIC METABOLIC PANEL
Anion gap: 5 (ref 5–15)
BUN: 15 mg/dL (ref 6–20)
CHLORIDE: 106 mmol/L (ref 101–111)
CO2: 27 mmol/L (ref 22–32)
CREATININE: 0.79 mg/dL (ref 0.44–1.00)
Calcium: 8.3 mg/dL — ABNORMAL LOW (ref 8.9–10.3)
GFR calc non Af Amer: >60 mL/min (ref >60)
Glucose, Bld: 119 mg/dL — ABNORMAL HIGH (ref 65–99)
POTASSIUM: 3.6 mmol/L (ref 3.5–5.1)
Sodium: 138 mmol/L (ref 135–145)

## 2016-07-29 LAB — LIPID PANEL
CHOL/HDL RATIO: 3.7 ratio
Cholesterol: 161 mg/dL (ref 0–200)
HDL: 43 mg/dL (ref >40)
LDL CALC: 87 mg/dL (ref 0–99)
Triglycerides: 153 mg/dL — ABNORMAL HIGH (ref <150)
VLDL: 31 mg/dL (ref 0–40)

## 2016-07-29 MED ORDER — ONDANSETRON HCL 4 MG/2ML IJ SOLN: 4.0000 mg | Freq: Four times a day (QID) | INTRAMUSCULAR | Status: DC | PRN

## 2016-07-29 MED ORDER — ROSUVASTATIN CALCIUM 20 MG PO TABS: 20.0000 mg | ORAL_TABLET | Freq: Every day | ORAL | Status: DC

## 2016-07-29 MED ORDER — ASPIRIN 81 MG PO CHEW
81.0000 mg | CHEWABLE_TABLET | Freq: Every day | ORAL | Status: DC
Start: 2016-07-29 — End: 2016-07-29
  Administered 2016-07-29: 81 mg via ORAL
  Filled 2016-07-29: qty 1

## 2016-07-29 MED ORDER — ORAL CARE MOUTH RINSE: 15.0000 mL | Freq: Two times a day (BID) | OROMUCOSAL | Status: DC

## 2016-07-29 MED ORDER — BENZTROPINE MESYLATE 0.5 MG PO TABS
0.5000 mg | ORAL_TABLET | Freq: Two times a day (BID) | ORAL | Status: DC
  Administered 2016-07-29 (×2): 0.5 mg via ORAL
  Filled 2016-07-29 (×3): qty 1

## 2016-07-29 MED ORDER — CEFUROXIME AXETIL 500 MG PO TABS: 500.0000 mg | ORAL_TABLET | Freq: Two times a day (BID) | ORAL | 0 refills | Status: DC

## 2016-07-29 MED ORDER — INFLUENZA VAC SPLIT QUAD 0.5 ML IM SUSY: 0.5000 mL | PREFILLED_SYRINGE | INTRAMUSCULAR | Status: DC

## 2016-07-29 MED ORDER — ONDANSETRON HCL 4 MG PO TABS: 4.0000 mg | ORAL_TABLET | Freq: Four times a day (QID) | ORAL | Status: DC | PRN

## 2016-07-29 MED ORDER — ACETAMINOPHEN 650 MG RE SUPP: 650.0000 mg | Freq: Four times a day (QID) | RECTAL | Status: DC | PRN

## 2016-07-29 MED ORDER — PANTOPRAZOLE SODIUM 40 MG PO TBEC
40.0000 mg | DELAYED_RELEASE_TABLET | Freq: Every day | ORAL | Status: DC
Start: 2016-07-29 — End: 2016-07-29
  Administered 2016-07-29: 11:00:00 40 mg via ORAL
  Filled 2016-07-29: qty 1

## 2016-07-29 MED ORDER — METOPROLOL SUCCINATE ER 25 MG PO TB24
25.0000 mg | ORAL_TABLET | Freq: Every day | ORAL | Status: DC
  Administered 2016-07-29: 11:00:00 25 mg via ORAL
  Filled 2016-07-29: qty 1

## 2016-07-29 MED ORDER — ENOXAPARIN SODIUM 40 MG/0.4ML ~~LOC~~ SOLN
40.0000 mg | SUBCUTANEOUS | Status: DC
Start: 2016-07-29 — End: 2016-07-29

## 2016-07-29 MED ORDER — ACETAMINOPHEN 325 MG PO TABS: 650.0000 mg | ORAL_TABLET | Freq: Four times a day (QID) | ORAL | Status: DC | PRN

## 2016-07-29 MED ORDER — CEFTRIAXONE SODIUM-DEXTROSE 2-2.22 GM-% IV SOLR
2.0000 g | INTRAVENOUS | Status: DC
  Filled 2016-07-29: qty 50

## 2016-07-29 NOTE — Discharge Summary (Signed)
Paula Reid at [place] NAME: Paula Reid    MR#:  [id number]  DATE OF BIRTH:  12-30-1955  DATE OF ADMISSION:  07/28/2016 ADMITTING PHYSICIAN: Lance Coon, MD  DATE OF DISCHARGE: 07/29/2016  PRIMARY CARE PHYSICIAN: Tracie Harrier, MD    ADMISSION DIAGNOSIS:  Delirium [R41.0] Acute cystitis without hematuria [N30.00]  DISCHARGE DIAGNOSIS:  Principal Problem:   UTI (urinary tract infection) Active Problems:   Acute encephalopathy   Schizophrenia (HCC)   HTN (hypertension)   GERD (gastroesophageal reflux disease)   SECONDARY DIAGNOSIS:   Past Medical History:  Diagnosis Date  . GERD (gastroesophageal reflux disease)   . Hypertension   . Schizophrenia [facility])     HOSPITAL COURSE:  61 year old female with apparent schizophrenia who presents with daily encephalopathy thought to be due to urinary tract infection and now with aphasia.  1. Acute Encephalopathy due to urinary tract infection  Patient is completely back at her baseline.   2. Aphasia: Early in the morning patient was having aphasia, so I ordered MRI brain. MRI of the brain was negative for acute CVA. When I went to call patient and told her results she was speaking in full sentences without aphasia. Seen his eye had mentioned my name of Dr. Donne Robillard she began to have aphasia. Then as it explained to her that the MRI was negative her aphasia completely have resolved and she was again speaking full sentences. She had no other focal neurological deficits. I am not suspecting this is a neurological etiology for her aphasia.   3. UTI: She will be discharged on Ceftin 4. Schizophrenia: She will need follow-up with her psychiatrist.  5. Essential hypertension on metoprolol  6. GERD on PPI    DISCHARGE CONDITIONS AND DIET:   Stable for discharge a regular diet  CONSULTS OBTAINED:  Treatment Team:  Gonzella Lex, MD Alexis Goodell, MD  DRUG ALLERGIES:   Allergies   Allergen Reactions  . Codeine     DISCHARGE MEDICATIONS:   Current Discharge Medication List    START taking these medications   Details  cefUROXime (CEFTIN) 500 MG tablet Take 1 tablet (500 mg total) by mouth 2 (two) times daily with a meal. Qty: 12 tablet, Refills: 0      CONTINUE these medications which have NOT CHANGED   Details  benztropine (COGENTIN) 0.5 MG tablet Take 0.5 mg by mouth 3 (three) times daily as needed.     cloZAPine (CLOZARIL) 25 MG tablet Take 525 mg by mouth at bedtime.     glycopyrrolate (ROBINUL) 2 MG tablet Take 8 mg by mouth at bedtime.     metoprolol succinate (TOPROL-XL) 25 MG 24 hr tablet Take 25 mg by mouth daily. Take with or immediately following a meal.     pyridOXINE (VITAMIN B-6) 100 MG tablet Take 100 mg by mouth daily.    ergocalciferol (VITAMIN D2) 50000 UNITS capsule Take 50,000 Units by mouth once a week.    omeprazole (PRILOSEC) 20 MG capsule Take 20 mg by mouth daily.              Today   CHIEF COMPLAINT:   Patient was having " aphasia" earlier   VITAL SIGNS:  Blood pressure (!) 142/80, pulse 80, temperature 98.4 F (36.9 C), resp. rate 20, height 5\' 9"  (1.753 m), weight 71.9 kg (158 lb 9.6 oz), SpO2 96 %.   REVIEW OF SYSTEMS:  Review of Systems  Constitutional: Negative.  Negative for chills, fever  and malaise/fatigue.  HENT: Negative.  Negative for ear discharge, ear pain, hearing loss, nosebleeds and sore throat.   Eyes: Negative.  Negative for blurred vision and pain.  Respiratory: Negative.  Negative for cough, hemoptysis, shortness of breath and wheezing.   Cardiovascular: Negative.  Negative for chest pain, palpitations and leg swelling.  Gastrointestinal: Negative.  Negative for abdominal pain, blood in stool, diarrhea, nausea and vomiting.  Genitourinary: Negative.  Negative for dysuria.  Musculoskeletal: Negative.  Negative for back pain.  Skin: Negative.   Neurological: Negative for dizziness,  tremors, speech change, focal weakness, seizures and headaches.  Endo/Heme/Allergies: Negative.  Does not bruise/bleed easily.  Psychiatric/Behavioral: Negative.  Negative for depression, hallucinations and suicidal ideas.     PHYSICAL EXAMINATION:  GENERAL:  61 y.o.-year-old patient lying in the bed with no acute distress.  NECK:  Supple, no jugular venous distention. No thyroid enlargement, no tenderness.  LUNGS: Normal breath sounds bilaterally, no wheezing, rales,rhonchi  No use of accessory muscles of respiration.  CARDIOVASCULAR: S1, S2 normal. No murmurs, rubs, or gallops.  ABDOMEN: Soft, non-tender, non-distended. Bowel sounds present. No organomegaly or mass.  EXTREMITIES: No pedal edema, cyanosis, or clubbing.  PSYCHIATRIC: The patient is alert and oriented x 3.  SKIN: No obvious rash, lesion, or ulcer.   DATA REVIEW:   CBC  Recent Labs Lab 07/29/16 0354  WBC 5.7  HGB 12.4  HCT 34.7*  PLT 150    Chemistries   Recent Labs Lab 07/28/16 1527 07/29/16 0354  NA 137 138  K 4.1 3.6  CL 105 106  CO2 23 27  GLUCOSE 83 119*  BUN 16 15  CREATININE 0.83 0.79  CALCIUM 8.9 8.3*  AST 21  --   ALT 14  --   ALKPHOS 46  --   BILITOT 1.1  --     Cardiac Enzymes No results for input(s): TROPONINI in the last 168 hours.  Microbiology Results  @[id number] @  RADIOLOGY:  Ct Head Wo Contrast  Result Date: 07/28/2016 CLINICAL DATA:  Increased confusion in the past 2 days. EXAM: CT HEAD WITHOUT CONTRAST TECHNIQUE: Contiguous axial images were obtained from the base of the skull through the vertex without intravenous contrast. COMPARISON:  01/13/2015 FINDINGS: Brain: There is no evidence of acute cortical infarct, intracranial hemorrhage, mass, midline shift, or extra-axial fluid collection. The ventricles and sulci are normal for age. Vascular: No hyperdense vessel or unexpected calcification. Skull: No fracture or focal osseous lesion. Sinuses/Orbits: No significant sinus  disease.  Unremarkable orbits. Other: None. IMPRESSION: Unremarkable head CT for age. Electronically Signed   By: Logan Bores M.D.   On: 07/28/2016 19:05   Mr Brain Wo Contrast  Result Date: 07/29/2016 CLINICAL DATA:  61 year old female whose family has noticed altered gait and trouble remembering things. Personal history of schizophrenia. Initial encounter. EXAM: MRI HEAD WITHOUT CONTRAST TECHNIQUE: Multiplanar, multiecho pulse sequences of the brain and surrounding structures were obtained without intravenous contrast. COMPARISON:  Head CT without contrast 07/28/2016 and earlier. FINDINGS: Brain: Cerebral volume is within normal limits for age. No restricted diffusion to suggest acute infarction. No midline shift, mass effect, evidence of mass lesion, ventriculomegaly, extra-axial collection or acute intracranial hemorrhage. Cervicomedullary junction and pituitary are within normal limits. Incidental cystic change of the pineal gland is a normal variant and stable since 2016. Questionable punctate chronic microhemorrhage in the left frontal operculum (series 11, image 18). No other chronic cerebral blood products identified. Otherwise no cerebral white matter signal abnormality. No cortical encephalomalacia.  Deep gray matter nuclei, the brainstem, and cerebellum are normal. Temporal lobe structures appear normal for age. Vascular: Major intracranial vascular flow voids are preserved. Skull and upper cervical spine: Negative. Visualized bone marrow signal is within normal limits. Sinuses/Orbits: Normal orbits soft tissues. Visualized paranasal sinuses and mastoids are stable and well pneumatized. Other: Negative scalp soft tissues. Visible internal auditory structures appear normal. IMPRESSION: No acute intracranial abnormality and essentially normal for age noncontrast MRI appearance the brain. Electronically Signed   By: Genevie Ann M.D.   On: 07/29/2016 10:26   US Carotid Bilateral  Result Date:  07/29/2016 CLINICAL DATA:  61 year old female with stroke-like symptoms EXAM: BILATERAL CAROTID DUPLEX ULTRASOUND TECHNIQUE: Pearline Cables scale imaging, color Doppler and duplex ultrasound were performed of bilateral carotid and vertebral arteries in the neck. COMPARISON:  Head CT 07/28/2016 FINDINGS: Criteria: Quantification of carotid stenosis is based on velocity parameters that correlate the residual internal carotid diameter with NASCET-based stenosis levels, using the diameter of the distal internal carotid lumen as the denominator for stenosis measurement. The following velocity measurements were obtained: RIGHT ICA:  96/36 cm/sec CCA:  [id number] cm/sec SYSTOLIC ICA/CCA RATIO:  1.2 DIASTOLIC ICA/CCA RATIO:  2.1 ECA:  95 cm/sec LEFT ICA:  110/46 cm/sec CCA:  [id number] cm/sec SYSTOLIC ICA/CCA RATIO:  1.2 DIASTOLIC ICA/CCA RATIO:  2.1 ECA:  114 cm/sec RIGHT CAROTID ARTERY: No significant atherosclerotic plaque or evidence of focal stenosis. RIGHT VERTEBRAL ARTERY:  Patent with normal antegrade flow. LEFT CAROTID ARTERY: No significant atherosclerotic plaque or evidence of focal stenosis. LEFT VERTEBRAL ARTERY:  Patent with normal antegrade flow IMPRESSION: Negative bilateral carotid duplex ultrasound. Signed, Criselda Peaches, MD [facility] Electronically Signed   By: Jacqulynn Cadet M.D.   On: 07/29/2016 10:58      Management plans discussed with the patient and she is in agreement. Stable for discharge home  Patient should follow up with pcp and psychiatry  CODE STATUS:     Code Status Orders        Start     Ordered   07/29/16 0041  Full code  Continuous     07/29/16 0040    Code Status History    Date Active Date Inactive Code Status Order ID Comments User Context   This patient has a current code status but no historical code status.      TOTAL TIME TAKING CARE OF THIS PATIENT: 38 minutes.    Note: This dictation was prepared  with Dragon dictation along with smaller phrase technology. Any transcriptional errors that result from this process are unintentional.  Shenique Childers M.D on 07/29/2016 at 12:15 PM  Between 7am to 6pm - Pager - [phone] After 6pm go to [email or web address] - password [email or web address] [facility]  Office  [phone]  CC: Primary care physician; Tracie Harrier, MD

## 2016-07-29 NOTE — Progress Notes (Signed)
CH responded to an OR for an AD. Pt was awake but did not present completely alert. [place] educated Pt on the AD. CH is not convinced the Pt completely understood all areas. CH does not at this time recommend completion of the AD. Pt stated that her brother would want to look over the document as well. [place] recommends a re-evaluation before completion of the document. Pt did not indicate a need for prayer or spiritual care at this time. CH is available for follow up as needed.    07/29/16 1000  Clinical Encounter Type  Visited With Patient  Visit Type Initial;Spiritual support  Referral From Nurse  Consult/Referral To Chaplain  Spiritual Encounters  Spiritual Needs Literature;Prayer;Emotional

## 2016-07-29 NOTE — Progress Notes (Signed)
Pharmacy Antibiotic Note  Paula Reid is a 61 y.o. female admitted on 07/28/2016 with UTI.  Pharmacy has been consulted for ceftriaxone dosing.  Plan: Ceftriaxone 2 grams q 24 hours ordered.  Height: 5\' 9"  (175.3 cm) Weight: 158 lb 9.6 oz (71.9 kg) IBW/kg (Calculated) : 66.2  Temp (24hrs), Avg:97.8 F (36.6 C), Min:97.5 F (36.4 C), Max:98.2 F (36.8 C)   Recent Labs Lab 07/28/16 1527  WBC 7.0  CREATININE 0.83    Estimated Creatinine Clearance: 75.3 mL/min (by C-G formula based on SCr of 0.83 mg/dL).    Allergies  Allergen Reactions  . Codeine     Antimicrobials this admission: ceftriaxone 1/24 >>    >>   Dose adjustments this admission:   Microbiology results: 1/24 UCx: pending      1/24 UA: LE(+) NO2 (-) WBC TNTC  Thank you for allowing pharmacy to be a part of this patient's care.  Mason Dibiasio S 07/29/2016 2:12 AM

## 2016-07-29 NOTE — Progress Notes (Signed)
[facility] [place] NAME: Paula Reid    MR#:  [id number]  DATE OF BIRTH:  February 17, 1956  SUBJECTIVE:   She is alert this morning however aphasic  REVIEW OF SYSTEMS:    Review of Systems  Constitutional: Negative.  Negative for chills, fever and malaise/fatigue.  HENT: Negative.  Negative for ear discharge, ear pain, hearing loss, nosebleeds and sore throat.   Eyes: Negative.  Negative for blurred vision and pain.  Respiratory: Negative.  Negative for cough, hemoptysis, shortness of breath and wheezing.   Cardiovascular: Negative.  Negative for chest pain, palpitations and leg swelling.  Gastrointestinal: Negative.  Negative for abdominal pain, blood in stool, diarrhea, nausea and vomiting.  Genitourinary: Negative.  Negative for dysuria.  Musculoskeletal: Negative.  Negative for back pain.  Skin: Negative.   Neurological: Positive for speech change. Negative for dizziness, tremors, focal weakness, seizures and headaches.  Endo/Heme/Allergies: Negative.  Does not bruise/bleed easily.  Psychiatric/Behavioral: Negative.  Negative for depression, hallucinations and suicidal ideas.    Tolerating Diet: yes      DRUG ALLERGIES:   Allergies  Allergen Reactions  . Codeine     VITALS:  Blood pressure (!) 105/58, pulse 71, temperature 98.4 F (36.9 C), resp. rate 20, height 5\' 9"  (1.753 m), weight 71.9 kg (158 lb 9.6 oz), SpO2 96 %.  PHYSICAL EXAMINATION:   Physical Exam    LABORATORY PANEL:   CBC  Recent Labs Lab 07/29/16 0354  WBC 5.7  HGB 12.4  HCT 34.7*  PLT 150   ------------------------------------------------------------------------------------------------------------------  Chemistries   Recent Labs Lab 07/28/16 1527 07/29/16 0354  NA 137 138  K 4.1 3.6  CL 105 106  CO2 23 27  GLUCOSE 83 119*  BUN 16 15  CREATININE 0.83 0.79  CALCIUM 8.9 8.3*  AST 21  --   ALT 14  --   ALKPHOS 46  --   BILITOT 1.1   --    ------------------------------------------------------------------------------------------------------------------  Cardiac Enzymes No results for input(s): TROPONINI in the last 168 hours. ------------------------------------------------------------------------------------------------------------------  RADIOLOGY:  Ct Head Wo Contrast  Result Date: 07/28/2016 CLINICAL DATA:  Increased confusion in the past 2 days. EXAM: CT HEAD WITHOUT CONTRAST TECHNIQUE: Contiguous axial images were obtained from the base of the skull through the vertex without intravenous contrast. COMPARISON:  01/13/2015 FINDINGS: Brain: There is no evidence of acute cortical infarct, intracranial hemorrhage, mass, midline shift, or extra-axial fluid collection. The ventricles and sulci are normal for age. Vascular: No hyperdense vessel or unexpected calcification. Skull: No fracture or focal osseous lesion. Sinuses/Orbits: No significant sinus disease.  Unremarkable orbits. Other: None. IMPRESSION: Unremarkable head CT for age. Electronically Signed   By: Paula Reid M.D.   On: 07/28/2016 19:05     ASSESSMENT AND PLAN:   61 year old female with apparent schizophrenia who presents with daily encephalopathy thought to be due to urinary tract infection and now with aphasia.  1. Aphasia: Order MRI stat to evaluate for CVA. Carotid Doppler requested. Start aspirin. Neuro checks every 4 hours. Continue cardiac monitoring 2 Acute encephalopathy with urinary tract infection and aphasia: Workup as above Continue Rocephin for UTI  3. UTI: Continue Rocephin and follow up on urine cultures  4. Schizophrenia: Psychiatry consultation pending. Follow up on their recommendations.  5. Essential hypertension on metoprolol  6. GERD on PPI  rounded with nursing   Management plans discussed with the patient and she is in agreement.  CODE STATUS: full  TOTAL  TIME TAKING CARE OF THIS PATIENT: 33 minutes.      POSSIBLE D/C tomorrow, DEPENDING ON CLINICAL CONDITION.   Paula Reid M.D on 07/29/2016 at 9:01 AM  Between 7am to 6pm - Pager - [phone] After 6pm go to [email or web address] - password [email or web address] [facility]  Office  [phone]  CC: Primary care physician; Paula Harrier, MD  Note: This dictation was prepared with Dragon dictation along with smaller phrase technology. Any transcriptional errors that result from this process are unintentional.

## 2016-07-29 NOTE — Evaluation (Signed)
Clinical/Bedside Swallow Evaluation Patient Details  Name: Paula Reid MRN: [id number] Date of Birth: 19-Mar-1956  Today's Date: 07/29/2016 Time: SLP Start Time (ACUTE ONLY): 1300 SLP Stop Time (ACUTE ONLY): 1400 SLP Time Calculation (min) (ACUTE ONLY): 60 min  Past Medical History:  Past Medical History:  Diagnosis Date  . GERD (gastroesophageal reflux disease)   . Hypertension   . Schizophrenia [facility])    Past Surgical History:  Past Surgical History:  Procedure Laterality Date  . BREAST BIOPSY Left 2002   Negative c Clip  . COLONOSCOPY N/A 02/21/2015   Procedure: COLONOSCOPY;  Surgeon: Paula Class, MD;  Location: [facility] ENDOSCOPY;  Service: Endoscopy;  Laterality: N/A;   HPI:   Pt  is a 61 y.o. female who presents with Several days of progressive confusion. Patient is a paranoid schizophrenic, who has been stable and high functioning on medication for a long time. She was having episodes of confusion at work for the past couple of days, and today did not show up for work. Her supervisor called her brother, who had a conversation with her primary psychiatrist. Her psychiatrist spoke with the patient over the phone, and then recommended she be brought to the ED for evaluation. There is some suspicion of potential medication confusion given that when her brother went to pick her up from her house her medications were laid out, but not in an orderly manner as they usually are. Here the patient was found to also have a UTI. Currently, alert/oriented and asking about "going home soon".   Assessment / Plan / Recommendation Clinical Impression  Pt appeared to present w/ adequate toleration of diet w/ no overt s/s of aspiration noted - reduced risk for aspiration when following general aspiration precautions. Pt fed self food/drinks slowly and using small bites/sips the majority of the time. Oral phase appeared appropriate for all bolus management and clearing. Recommend pt continue her  current diet w/ thin liquids; general aspiration precautions. No further skilled ST services indicated at this time; NSG to reconsult as needed if any change in swallowing status. Due to pt's dysfluency of speech presentation, general recommendations were given to slow dow, pause/take a deep breath, then begin again if experiencing the dyslfuency/stuttering behavior. Explained that during times of stress(hospitalization), stress can be a relevent issue. Due to pt stating she was "frustrated" by her speech "sometimes", contact information was given for her to reach out to Outpatient ST services if she desired f/u for such. Noted MD's recent notes indicating periods of fluent speech. NSG updated.      Aspiration Risk   (reduced)    Diet Recommendation  Regular diet w/ thin liquids; general aspiration precautions  Medication Administration: Whole meds with liquid    Other  Recommendations Recommended Consults:  (none) Oral Care Recommendations: Oral care BID;Patient independent with oral care   Follow up Recommendations None      Frequency and Duration   n/a         Prognosis Prognosis for Safe Diet Advancement: Good      Swallow Study   General Date of Onset: 07/28/16 Type of Study: Bedside Swallow Evaluation Previous Swallow Assessment: none indicated Diet Prior to this Study: Regular;Thin liquids Temperature Spikes Noted: No (wbc not elevated) Respiratory Status: Room air History of Recent Intubation: No Behavior/Cognition: Alert;Cooperative;Pleasant mood Oral Cavity Assessment: Within Functional Limits Oral Care Completed by SLP: Recent completion by staff Oral Cavity - Dentition: Adequate natural dentition Vision: Functional for self-feeding Self-Feeding Abilities: Able to  feed self Patient Positioning: Upright in bed Baseline Vocal Quality: Normal (dysfluency of speech) Volitional Cough: Strong Volitional Swallow: Able to elicit    Oral/Motor/Sensory Function Overall  Oral Motor/Sensory Function: Within functional limits   Ice Chips Ice chips: Not tested   Thin Liquid Thin Liquid: Within functional limits Presentation: Self Fed;Straw (~6 ozs)    Nectar Thick Nectar Thick Liquid: Not tested   Honey Thick Honey Thick Liquid: Not tested   Puree Puree: Not tested   Solid   GO   Solid: Not tested Other Comments: declined but finished her lunch meal stating she had no trouble chewing or swallowing the solid foods         Paula Kenner, MS, CCC-SLP Paula Reid 07/29/2016,2:04 PM

## 2016-07-29 NOTE — Progress Notes (Signed)
I called patient's room to explain the results of the MRI which are negative for acute stroke. She started speaking very fluently sating HIDA I get to the bathroom who I have to call as soon as I told the patient that my name is Dr. Trimaine Maser and I was giving her the results of the MRI she again began to have "a few". Then as I asked her what was happening to her speech now it appears that it was more fluent she started talking in full sentences without any aphasia.  I will discontinue neuro checks in light of the MRI findings. I have also paged Dr. Weber Cooks to discuss the case with him.

## 2016-07-29 NOTE — Progress Notes (Signed)
Called by Lance Coon, [facility].  RN asked for update, last set of VS.  RN relayed VS, reported pt stable in NAD.

## 2016-07-29 NOTE — Plan of Care (Signed)
Problem: Education: Goal: Knowledge of [facility] Education information/materials will improve Outcome: Progressing Pt oriented to unit, including call bell, bed alarm for safety.  No complaints overnight.  Bed in low position, call bell within reach.  WCTM.

## 2016-07-30 LAB — CLOZAPINE (CLOZARIL)
Clozapine Lvl: 271 ng/mL — ABNORMAL LOW (ref 350–650)
NORCLOZAPINE: 185 ng/mL
Total(Cloz+Norcloz): 456 ng/mL

## 2016-07-30 LAB — URINE CULTURE

## 2017-01-21 ENCOUNTER — Other Ambulatory Visit: Payer: Self-pay | Admitting: Internal Medicine

## 2017-01-21 DIAGNOSIS — Z1231 Encounter for screening mammogram for malignant neoplasm of breast: Secondary | ICD-10-CM

## 2017-01-27 ENCOUNTER — Other Ambulatory Visit: Payer: Self-pay | Admitting: Internal Medicine

## 2017-01-27 ENCOUNTER — Ambulatory Visit
Admission: RE | Admit: 2017-01-27 | Discharge: 2017-01-27 | Disposition: A | Discharge status: Home / Self Care | Payer: Managed Care, Other (non HMO) | Source: Ambulatory Visit | Attending: Internal Medicine | Admitting: Internal Medicine

## 2017-01-27 DIAGNOSIS — Z1231 Encounter for screening mammogram for malignant neoplasm of breast: Secondary | ICD-10-CM

## 2019-04-12 ENCOUNTER — Other Ambulatory Visit: Payer: Self-pay | Admitting: Internal Medicine

## 2019-04-12 DIAGNOSIS — Z1231 Encounter for screening mammogram for malignant neoplasm of breast: Secondary | ICD-10-CM

## 2020-01-31 ENCOUNTER — Other Ambulatory Visit: Payer: Self-pay | Admitting: Family Medicine

## 2020-01-31 DIAGNOSIS — R1310 Dysphagia, unspecified: Secondary | ICD-10-CM

## 2020-01-31 DIAGNOSIS — K219 Gastro-esophageal reflux disease without esophagitis: Secondary | ICD-10-CM

## 2020-02-04 ENCOUNTER — Other Ambulatory Visit: Payer: Self-pay

## 2020-02-04 ENCOUNTER — Ambulatory Visit
Admission: RE | Admit: 2020-02-04 | Discharge: 2020-02-04 | Disposition: A | Discharge status: Home / Self Care | Payer: Medicare HMO | Source: Ambulatory Visit | Attending: Family Medicine | Admitting: Family Medicine

## 2020-02-04 DIAGNOSIS — K219 Gastro-esophageal reflux disease without esophagitis: Secondary | ICD-10-CM | POA: Diagnosis not present

## 2020-02-04 DIAGNOSIS — R1310 Dysphagia, unspecified: Secondary | ICD-10-CM | POA: Insufficient documentation

## 2020-03-14 ENCOUNTER — Other Ambulatory Visit
Admission: RE | Admit: 2020-03-14 | Discharge: 2020-03-14 | Disposition: A | Discharge status: Home / Self Care | Payer: Medicare HMO | Source: Ambulatory Visit | Attending: Gastroenterology | Admitting: Gastroenterology

## 2020-03-14 ENCOUNTER — Other Ambulatory Visit: Payer: Self-pay

## 2020-03-14 DIAGNOSIS — Z01812 Encounter for preprocedural laboratory examination: Secondary | ICD-10-CM | POA: Insufficient documentation

## 2020-03-14 DIAGNOSIS — Z20822 Contact with and (suspected) exposure to covid-19: Secondary | ICD-10-CM | POA: Insufficient documentation

## 2020-03-15 LAB — SARS CORONAVIRUS 2 (TAT 6-24 HRS): SARS Coronavirus 2: NEGATIVE

## 2020-03-17 ENCOUNTER — Encounter: Payer: Self-pay | Admitting: *Deleted

## 2020-03-17 ENCOUNTER — Other Ambulatory Visit: Payer: Managed Care, Other (non HMO)

## 2020-03-18 ENCOUNTER — Other Ambulatory Visit: Payer: Self-pay

## 2020-03-18 ENCOUNTER — Encounter: Payer: Self-pay | Admitting: Anesthesiology

## 2020-03-18 ENCOUNTER — Ambulatory Visit: Payer: Medicare HMO | Admitting: Anesthesiology

## 2020-03-18 ENCOUNTER — Encounter
Admission: RE | Disposition: A | Discharge status: Home / Self Care | Payer: Self-pay | Source: Home / Self Care | Attending: Gastroenterology

## 2020-03-18 ENCOUNTER — Ambulatory Visit
Admission: RE | Admit: 2020-03-18 | Discharge: 2020-03-18 | Disposition: A | Discharge status: Home / Self Care | Payer: Medicare HMO | Attending: Gastroenterology | Admitting: Gastroenterology

## 2020-03-18 DIAGNOSIS — Z86010 Personal history of colonic polyps: Secondary | ICD-10-CM | POA: Insufficient documentation

## 2020-03-18 DIAGNOSIS — Z885 Allergy status to narcotic agent status: Secondary | ICD-10-CM | POA: Insufficient documentation

## 2020-03-18 DIAGNOSIS — K317 Polyp of stomach and duodenum: Secondary | ICD-10-CM | POA: Diagnosis not present

## 2020-03-18 DIAGNOSIS — R1013 Epigastric pain: Secondary | ICD-10-CM | POA: Diagnosis present

## 2020-03-18 DIAGNOSIS — K319 Disease of stomach and duodenum, unspecified: Secondary | ICD-10-CM | POA: Diagnosis not present

## 2020-03-18 DIAGNOSIS — I10 Essential (primary) hypertension: Secondary | ICD-10-CM | POA: Insufficient documentation

## 2020-03-18 DIAGNOSIS — K2100 Gastro-esophageal reflux disease with esophagitis, without bleeding: Secondary | ICD-10-CM | POA: Diagnosis not present

## 2020-03-18 DIAGNOSIS — Z9049 Acquired absence of other specified parts of digestive tract: Secondary | ICD-10-CM | POA: Diagnosis not present

## 2020-03-18 DIAGNOSIS — Z79899 Other long term (current) drug therapy: Secondary | ICD-10-CM | POA: Diagnosis not present

## 2020-03-18 DIAGNOSIS — F209 Schizophrenia, unspecified: Secondary | ICD-10-CM | POA: Diagnosis not present

## 2020-03-18 DIAGNOSIS — Z1211 Encounter for screening for malignant neoplasm of colon: Secondary | ICD-10-CM | POA: Insufficient documentation

## 2020-03-18 DIAGNOSIS — K5730 Diverticulosis of large intestine without perforation or abscess without bleeding: Secondary | ICD-10-CM | POA: Diagnosis not present

## 2020-03-18 DIAGNOSIS — R1310 Dysphagia, unspecified: Secondary | ICD-10-CM | POA: Insufficient documentation

## 2020-03-18 DIAGNOSIS — K2970 Gastritis, unspecified, without bleeding: Secondary | ICD-10-CM | POA: Diagnosis not present

## 2020-03-18 HISTORY — PX: COLONOSCOPY WITH PROPOFOL: SHX5780

## 2020-03-18 HISTORY — PX: ESOPHAGOGASTRODUODENOSCOPY (EGD) WITH PROPOFOL: SHX5813

## 2020-03-18 SURGERY — COLONOSCOPY WITH PROPOFOL
Anesthesia: General

## 2020-03-18 MED ORDER — LIDOCAINE HCL (CARDIAC) PF 100 MG/5ML IV SOSY
PREFILLED_SYRINGE | INTRAVENOUS | Status: DC | PRN
  Administered 2020-03-18: 50 mg via INTRAVENOUS

## 2020-03-18 MED ORDER — SODIUM CHLORIDE 0.9 % IV SOLN: INTRAVENOUS | Status: DC

## 2020-03-18 MED ORDER — PROPOFOL 10 MG/ML IV BOLUS
INTRAVENOUS | Status: DC | PRN
  Administered 2020-03-18: 160 mg via INTRAVENOUS

## 2020-03-18 MED ORDER — PROMETHAZINE HCL 25 MG/ML IJ SOLN: 6.2500 mg | INTRAMUSCULAR | Status: DC | PRN

## 2020-03-18 MED ORDER — PROPOFOL 500 MG/50ML IV EMUL
INTRAVENOUS | Status: AC
  Filled 2020-03-18: qty 50

## 2020-03-18 MED ORDER — DEXAMETHASONE SODIUM PHOSPHATE 10 MG/ML IJ SOLN
INTRAMUSCULAR | Status: AC
  Filled 2020-03-18: qty 1

## 2020-03-18 MED ORDER — ROCURONIUM BROMIDE 10 MG/ML (PF) SYRINGE
PREFILLED_SYRINGE | INTRAVENOUS | Status: AC
  Filled 2020-03-18: qty 10

## 2020-03-18 MED ORDER — ONDANSETRON HCL 4 MG/2ML IJ SOLN
INTRAMUSCULAR | Status: AC
  Filled 2020-03-18: qty 2

## 2020-03-18 MED ORDER — PROMETHAZINE HCL 25 MG/ML IJ SOLN
INTRAMUSCULAR | Status: AC
  Administered 2020-03-18: 6.25 mg via INTRAVENOUS
  Filled 2020-03-18: qty 1

## 2020-03-18 MED ORDER — FENTANYL CITRATE (PF) 100 MCG/2ML IJ SOLN
INTRAMUSCULAR | Status: AC
  Filled 2020-03-18: qty 2

## 2020-03-18 MED ORDER — EPHEDRINE SULFATE 50 MG/ML IJ SOLN
INTRAMUSCULAR | Status: DC | PRN
  Administered 2020-03-18: 10 mg via INTRAVENOUS

## 2020-03-18 MED ORDER — DEXAMETHASONE SODIUM PHOSPHATE 10 MG/ML IJ SOLN
INTRAMUSCULAR | Status: DC | PRN
  Administered 2020-03-18: 5 mg via INTRAVENOUS

## 2020-03-18 MED ORDER — LIDOCAINE HCL (PF) 2 % IJ SOLN
INTRAMUSCULAR | Status: AC
  Filled 2020-03-18: qty 5

## 2020-03-18 MED ORDER — SUGAMMADEX SODIUM 200 MG/2ML IV SOLN
INTRAVENOUS | Status: DC | PRN
  Administered 2020-03-18: 100 mg via INTRAVENOUS

## 2020-03-18 MED ORDER — FENTANYL CITRATE (PF) 100 MCG/2ML IJ SOLN: 25.0000 ug | INTRAMUSCULAR | Status: DC | PRN

## 2020-03-18 MED ORDER — FENTANYL CITRATE (PF) 100 MCG/2ML IJ SOLN
INTRAMUSCULAR | Status: DC | PRN
  Administered 2020-03-18: 50 ug via INTRAVENOUS

## 2020-03-18 MED ORDER — SUCCINYLCHOLINE CHLORIDE 20 MG/ML IJ SOLN
INTRAMUSCULAR | Status: DC | PRN
  Administered 2020-03-18: 170 mg via INTRAVENOUS

## 2020-03-18 MED ORDER — MIDAZOLAM HCL 2 MG/2ML IJ SOLN
INTRAMUSCULAR | Status: DC | PRN
  Administered 2020-03-18: 1 mg via INTRAVENOUS

## 2020-03-18 MED ORDER — ONDANSETRON HCL 4 MG/2ML IJ SOLN
INTRAMUSCULAR | Status: DC | PRN
  Administered 2020-03-18: 4 mg via INTRAVENOUS

## 2020-03-18 MED ORDER — MIDAZOLAM HCL 2 MG/2ML IJ SOLN
INTRAMUSCULAR | Status: AC
  Filled 2020-03-18: qty 2

## 2020-03-18 MED ORDER — ROCURONIUM BROMIDE 100 MG/10ML IV SOLN
INTRAVENOUS | Status: DC | PRN
  Administered 2020-03-18: 10 mg via INTRAVENOUS

## 2020-03-18 MED ORDER — SUCCINYLCHOLINE CHLORIDE 200 MG/10ML IV SOSY
PREFILLED_SYRINGE | INTRAVENOUS | Status: AC
  Filled 2020-03-18: qty 10

## 2020-03-18 MED ORDER — SODIUM CHLORIDE FLUSH 0.9 % IV SOLN
INTRAVENOUS | Status: AC
  Filled 2020-03-18: qty 10

## 2020-03-18 NOTE — Anesthesia Procedure Notes (Signed)
Procedure Name: Intubation Performed by: Vaughan Sine Pre-anesthesia Checklist: Patient identified, Emergency Drugs available, Suction available, Patient being monitored and Timeout performed Patient Re-evaluated:Patient Re-evaluated prior to induction Oxygen Delivery Method: Circle system utilized Preoxygenation: Pre-oxygenation with 100% oxygen Induction Type: IV induction and Cricoid Pressure applied Ventilation: Mask ventilation without difficulty Laryngoscope Size: Miller and 3 Grade View: Grade II Tube type: Oral Tube size: 7.0 mm Airway Equipment and Method: Rigid stylet and Bite block Placement Confirmation: CO2 detector,  ETT inserted through vocal cords under direct vision and positive ETCO2 Secured at: 21 cm Tube secured with: Tape

## 2020-03-18 NOTE — Anesthesia Preprocedure Evaluation (Addendum)
Anesthesia Evaluation  Patient identified by MRN, date of birth, ID band Patient awake    Reviewed: Allergy & Precautions, H&P , NPO status , Patient's Chart, lab work & pertinent test results  History of Anesthesia Complications Negative for: history of anesthetic complications  Airway Mallampati: II  TM Distance: >3 FB     Dental  (+) Chipped   Pulmonary neg pulmonary ROS, neg sleep apnea, neg COPD,    breath sounds clear to auscultation       Cardiovascular hypertension, (-) angina(-) Past MI and (-) Cardiac Stents (-) dysrhythmias  Rhythm:regular Rate:Normal     Neuro/Psych PSYCHIATRIC DISORDERS Schizophrenia negative neurological ROS     GI/Hepatic Neg liver ROS, GERD  Controlled,  Endo/Other  negative endocrine ROS  Renal/GU negative Renal ROS  negative genitourinary   Musculoskeletal   Abdominal   Peds  Hematology negative hematology ROS (+)   Anesthesia Other Findings N/V today  Past Medical History: No date: GERD (gastroesophageal reflux disease) No date: Hypertension No date: Schizophrenia [facility])  Past Surgical History: 2002: BREAST BIOPSY; Left     Comment:  Negative c Clip 02/21/2015: COLONOSCOPY; N/A     Comment:  Procedure: COLONOSCOPY;  Surgeon: Josefine Class,               MD;  Location: [facility] ENDOSCOPY;  Service: Endoscopy;                Laterality: N/A;  BMI    Body Mass Index: 31.64 kg/m      Reproductive/Obstetrics negative OB ROS                            Anesthesia Physical Anesthesia Plan  ASA: II  Anesthesia Plan: General ETT and Rapid Sequence   Post-op Pain Management:    Induction:   PONV Risk Score and Plan: Ondansetron  Airway Management Planned: Nasal Cannula  Additional Equipment:   Intra-op Plan:   Post-operative Plan:   Informed Consent: I have reviewed the patients History and Physical, chart, labs and discussed the  procedure including the risks, benefits and alternatives for the proposed anesthesia with the patient or authorized representative who has indicated his/her understanding and acceptance.     Dental Advisory Given  Plan Discussed with: Anesthesiologist, CRNA and Surgeon  Anesthesia Plan Comments:        Anesthesia Quick Evaluation

## 2020-03-18 NOTE — H&P (Signed)
Outpatient short stay form Pre-procedure 03/18/2020 11:02 AM Paula Miyamoto MD, MPH  Primary Physician: Dr. Ginette Pitman  Reason for visit:  Dysphagia/Surveillance  History of present illness:   64 y/o lady with schizophrenia here for EGD for reported dysphagia to solids and liquids and colonoscopy surveillance for history of non-advanced adenoma. No blood thinners. History of cholecystectomy. Denies family history of GI malignancies   Current Facility-Administered Medications:  .  0.9 %  sodium chloride infusion, , Intravenous, Continuous, Vermell Madrid, Hilton Cork, MD  Medications Prior to Admission  Medication Sig Dispense Refill Last Dose  . acetaminophen (TYLENOL) 325 MG tablet Take 325 mg by mouth every 6 (six) hours as needed.   Past Week at Unknown time  . escitalopram (LEXAPRO) 10 MG tablet Take 10 mg by mouth daily.   03/18/2020 at Unknown time  . pantoprazole (PROTONIX) 40 MG tablet Take 40 mg by mouth daily.   03/17/2020 at Unknown time  . pyridOXINE (VITAMIN B-6) 100 MG tablet Take 100 mg by mouth daily.   Past Week at Unknown time  . sucralfate (CARAFATE) 1 g tablet Take 1 g by mouth 4 (four) times daily -  with meals and at bedtime.     . vitamin B-12 (CYANOCOBALAMIN) 100 MCG tablet Take 100 mcg by mouth daily.   Past Week at Unknown time  . benztropine (COGENTIN) 0.5 MG tablet Take 0.5 mg by mouth 3 (three) times daily as needed.  (Patient not taking: Reported on 03/18/2020)   Not Taking at Unknown time  . cefUROXime (CEFTIN) 500 MG tablet Take 1 tablet (500 mg total) by mouth 2 (two) times daily with a meal. (Patient not taking: Reported on 03/18/2020) 12 tablet 0 Completed Course at Unknown time  . cloZAPine (CLOZARIL) 25 MG tablet Take 525 mg by mouth at bedtime.      . ergocalciferol (VITAMIN D2) 50000 UNITS capsule Take 50,000 Units by mouth once a week. (Patient not taking: Reported on 03/18/2020)   Completed Course at Unknown time  . glycopyrrolate (ROBINUL) 2 MG tablet Take 8 mg by  mouth at bedtime.      . metoprolol succinate (TOPROL-XL) 25 MG 24 hr tablet Take 25 mg by mouth daily. Take with or immediately following a meal.      . omeprazole (PRILOSEC) 20 MG capsule Take 20 mg by mouth daily. (Patient not taking: Reported on 03/18/2020)   Completed Course at Unknown time     Allergies  Allergen Reactions  . Codeine      Past Medical History:  Diagnosis Date  . GERD (gastroesophageal reflux disease)   . Hypertension   . Schizophrenia ([place])     Review of systems:  Otherwise negative.    Physical Exam  Gen: Alert, oriented. Appears stated age.  HEENT: [place]/AT. PERRLA. Lungs: No respiratory distress Abd: soft, benign, no masses.  Ext: No edema.    Planned procedures: Proceed with EGD/colonoscopy. The patient understands the nature of the planned procedure, indications, risks, alternatives and potential complications including but not limited to bleeding, infection, perforation, damage to internal organs and possible oversedation/side effects from anesthesia. The patient agrees and gives consent to proceed.  Please refer to procedure notes for findings, recommendations and patient disposition/instructions.     Paula Miyamoto MD, MPH Gastroenterology 03/18/2020  11:02 AM

## 2020-03-18 NOTE — Transfer of Care (Signed)
Immediate Anesthesia Transfer of Care Note  Patient: Paula Reid  Procedure(s) Performed: COLONOSCOPY WITH PROPOFOL (N/A ) ESOPHAGOGASTRODUODENOSCOPY (EGD) WITH PROPOFOL (N/A )  Patient Location: PACU  Anesthesia Type:General  Level of Consciousness: awake and sedated  Airway & Oxygen Therapy: Patient Spontanous Breathing and Patient connected to face mask oxygen  Post-op Assessment: Report given to RN and Post -op Vital signs reviewed and stable  Post vital signs: Reviewed and stable  Last Vitals:  Vitals Value Taken Time  BP    Temp    Pulse    Resp    SpO2      Last Pain:  Vitals:   03/18/20 1102  TempSrc: Temporal  PainSc: 0-No pain         Complications: No complications documented.

## 2020-03-18 NOTE — Interval H&P Note (Signed)
History and Physical Interval Note:  03/18/2020 11:06 AM  Paula Reid  has presented today for surgery, with the diagnosis of ADEN POLYPS EPIG PAIN.  The various methods of treatment have been discussed with the patient and family. After consideration of risks, benefits and other options for treatment, the patient has consented to  Procedure(s): COLONOSCOPY WITH PROPOFOL (N/A) ESOPHAGOGASTRODUODENOSCOPY (EGD) WITH PROPOFOL (N/A) as a surgical intervention.  The patient's history has been reviewed, patient examined, no change in status, stable for surgery.  I have reviewed the patient's chart and labs.  Questions were answered to the patient's satisfaction.     Lesly Rubenstein  Ok to proceed with EGD/Colonoscopy

## 2020-03-18 NOTE — Op Note (Signed)
[facility] Gastroenterology Patient Name: Paula Reid Procedure Date: 03/18/2020 10:43 AM MRN: [id number] Account #: [id number] Date of Birth: 06/16/1956 Admit Type: Outpatient Age: 64 Room: [facility] ENDO ROOM 3 Gender: Female Note Status: Finalized Procedure:             Upper GI endoscopy Indications:           Epigastric abdominal pain, Dysphagia,                         Gastro-esophageal reflux disease Providers:             Andrey Farmer MD, MD Referring MD:          Tracie Harrier, MD (Referring MD) Medicines:             Monitored Anesthesia Care Complications:         No immediate complications. Estimated blood loss:                         Minimal. Procedure:             Pre-Anesthesia Assessment:                        - Prior to the procedure, a History and Physical was                         performed, and patient medications and allergies were                         reviewed. The patient is competent. The risks and                         benefits of the procedure and the sedation options and                         risks were discussed with the patient. All questions                         were answered and informed consent was obtained.                         Patient identification and proposed procedure were                         verified by the physician, the nurse, the anesthetist                         and the technician in the endoscopy suite. Mental                         Status Examination: alert and oriented. Airway                         Examination: normal oropharyngeal airway and neck                         mobility. Respiratory Examination: clear to                         auscultation.  CV Examination: normal. Prophylactic                         Antibiotics: The patient does not require prophylactic                         antibiotics. Prior Anticoagulants: The patient has                         taken no previous  anticoagulant or antiplatelet                         agents. ASA Grade Assessment: III - A patient with                         severe systemic disease. After reviewing the risks and                         benefits, the patient was deemed in satisfactory                         condition to undergo the procedure. The anesthesia                         plan was to use general anesthesia. Immediately prior                         to administration of medications, the patient was                         re-assessed for adequacy to receive sedatives. The                         heart rate, respiratory rate, oxygen saturations,                         blood pressure, adequacy of pulmonary ventilation, and                         response to care were monitored throughout the                         procedure. The physical status of the patient was                         re-assessed after the procedure.                        After obtaining informed consent, the endoscope was                         passed under direct vision. Throughout the procedure,                         the patient's blood pressure, pulse, and oxygen                         saturations were monitored continuously. The Endoscope  was introduced through the mouth, and advanced to the                         second part of duodenum. The upper GI endoscopy was                         accomplished without difficulty. The patient tolerated                         the procedure well. Findings:      The examined esophagus was normal. Biopsies were obtained from the       proximal and distal esophagus with cold forceps for histology of       suspected eosinophilic esophagitis. Estimated blood loss was minimal.      Scattered mild inflammation characterized by erythema was found in the       gastric antrum. Biopsies were taken with a cold forceps for Helicobacter       pylori testing. Estimated blood loss  was minimal.      A few 1 to 2 mm sessile polyps with no stigmata of recent bleeding were       found in the gastric body. The polyp was removed with a cold biopsy       forceps. Resection and retrieval were complete. Estimated blood loss was       minimal.      The examined duodenum was normal. Impression:            - Normal esophagus. Biopsied.                        - Gastritis. Biopsied.                        - A few gastric polyps. Resected and retrieved.                        - Normal examined duodenum. Recommendation:        - Discharge patient to home.                        - Resume previous diet.                        - Continue present medications.                        - Await pathology results.                        - Return to referring physician as previously                         scheduled. Procedure Code(s):     --- Professional ---                        [phone], Esophagogastroduodenoscopy, flexible,                         transoral; with biopsy, single or multiple Diagnosis Code(s):     --- Professional ---  K29.70, Gastritis, unspecified, without bleeding                        K31.7, Polyp of stomach and duodenum                        R10.13, Epigastric pain                        R13.10, Dysphagia, unspecified                        K21.9, Gastro-esophageal reflux disease without                         esophagitis CPT copyright 2019 [employer]. All rights reserved. The codes documented in this report are preliminary and upon coder review may  be revised to meet current compliance requirements. Andrey Farmer, MD Andrey Farmer MD, MD 03/18/2020 12:15:29 PM Number of Addenda: 0 Note Initiated On: 03/18/2020 10:43 AM Estimated Blood Loss:  Estimated blood loss was minimal.      [facility]

## 2020-03-18 NOTE — Anesthesia Postprocedure Evaluation (Signed)
Anesthesia Post Note  Patient: Paula Reid  Procedure(s) Performed: COLONOSCOPY WITH PROPOFOL (N/A ) ESOPHAGOGASTRODUODENOSCOPY (EGD) WITH PROPOFOL (N/A )  Patient location during evaluation: PACU Anesthesia Type: General Level of consciousness: awake and alert Pain management: pain level controlled Vital Signs Assessment: post-procedure vital signs reviewed and stable Respiratory status: spontaneous breathing, nonlabored ventilation and respiratory function stable Cardiovascular status: blood pressure returned to baseline and stable Postop Assessment: no apparent nausea or vomiting Anesthetic complications: no   No complications documented.   Last Vitals:  Vitals:   03/18/20 1249 03/18/20 1256  BP: 133/63 133/65  Pulse: 69 69  Resp: 15 15  Temp:  (!) 36.3 C  SpO2: 98% 98%    Last Pain:  Vitals:   03/18/20 1249  TempSrc:   PainSc: 0-No pain                 Brett Canales Domenique Southers

## 2020-03-18 NOTE — Op Note (Signed)
[facility] Gastroenterology Patient Name: Paula Reid Procedure Date: 03/18/2020 10:41 AM MRN: [id number] Account #: [id number] Date of Birth: August 16, 1955 Admit Type: Outpatient Age: 64 Room: [facility] ENDO ROOM 3 Gender: Female Note Status: Finalized Procedure:             Colonoscopy Indications:           High risk colon cancer surveillance: Personal history                         of colonic polyps Providers:             Andrey Farmer MD, MD Referring MD:          Tracie Harrier, MD (Referring MD) Medicines:             Monitored Anesthesia Care Complications:         No immediate complications. Procedure:             Pre-Anesthesia Assessment:                        - Prior to the procedure, a History and Physical was                         performed, and patient medications and allergies were                         reviewed. The patient is competent. The risks and                         benefits of the procedure and the sedation options and                         risks were discussed with the patient. All questions                         were answered and informed consent was obtained.                         Patient identification and proposed procedure were                         verified by the physician, the nurse, the anesthetist                         and the technician in the endoscopy suite. Mental                         Status Examination: alert and oriented. Airway                         Examination: normal oropharyngeal airway and neck                         mobility. Respiratory Examination: clear to                         auscultation. CV Examination: normal. Prophylactic  Antibiotics: The patient does not require prophylactic                         antibiotics. Prior Anticoagulants: The patient has                         taken no previous anticoagulant or antiplatelet                         agents. ASA  Grade Assessment: III - A patient with                         severe systemic disease. After reviewing the risks and                         benefits, the patient was deemed in satisfactory                         condition to undergo the procedure. The anesthesia                         plan was to use monitored anesthesia care (MAC).                         Immediately prior to administration of medications,                         the patient was re-assessed for adequacy to receive                         sedatives. The heart rate, respiratory rate, oxygen                         saturations, blood pressure, adequacy of pulmonary                         ventilation, and response to care were monitored                         throughout the procedure. The physical status of the                         patient was re-assessed after the procedure.                        After obtaining informed consent, the colonoscope was                         passed under direct vision. Throughout the procedure,                         the patient's blood pressure, pulse, and oxygen                         saturations were monitored continuously. The                         Colonoscope was introduced through the anus and  advanced to the the cecum, identified by appendiceal                         orifice and ileocecal valve. The colonoscopy was                         performed without difficulty. The patient tolerated                         the procedure well. The quality of the bowel                         preparation was excellent. Findings:      The perianal and digital rectal examinations were normal.      A few small-mouthed diverticula were found in the sigmoid colon and       cecum.      The exam was otherwise without abnormality on direct and retroflexion       views. Impression:            - Diverticulosis in the sigmoid colon and in the cecum.                         - The examination was otherwise normal on direct and                         retroflexion views.                        - No specimens collected. Recommendation:        - Discharge patient to home.                        - Resume previous diet.                        - Continue present medications.                        - Repeat colonoscopy in 10 years for surveillance.                        - Return to referring physician as previously                         scheduled. Procedure Code(s):     --- Professional ---                        Y6063, Colorectal cancer screening; colonoscopy on                         individual at high risk Diagnosis Code(s):     --- Professional ---                        Z86.010, Personal history of colonic polyps                        K57.30, Diverticulosis of large intestine without  perforation or abscess without bleeding CPT copyright 2019 American Medical Association. All rights reserved. The codes documented in this report are preliminary and upon coder review may  be revised to meet current compliance requirements. Andrey Farmer, MD Andrey Farmer MD, MD 03/18/2020 12:18:08 PM Number of Addenda: 0 Note Initiated On: 03/18/2020 10:41 AM Scope Withdrawal Time: 0 hours 8 minutes 23 seconds  Total Procedure Duration: 0 hours 12 minutes 1 second  Estimated Blood Loss:  Estimated blood loss: none.      [facility]

## 2020-03-19 LAB — SURGICAL PATHOLOGY

## 2020-04-21 ENCOUNTER — Ambulatory Visit: Payer: Medicare HMO | Attending: Internal Medicine

## 2020-04-21 DIAGNOSIS — Z23 Encounter for immunization: Secondary | ICD-10-CM

## 2020-04-21 NOTE — Progress Notes (Signed)
   Covid-19 Vaccination Clinic  Name:  Paula Reid    MRN: [id number] DOB: [birth year]  04/21/2020  [name] was observed post Covid-19 immunization for 15 minutes without incident. She was provided with Vaccine Information Sheet and instruction to access the V-Safe system.   [name] was instructed to call 911 with any severe reactions post vaccine: Marland Kitchen Difficulty breathing  . Swelling of face and throat  . A fast heartbeat  . A bad rash all over body  . Dizziness and weakness

## 2020-08-15 ENCOUNTER — Other Ambulatory Visit: Payer: Self-pay | Admitting: Internal Medicine

## 2020-08-15 DIAGNOSIS — Z1231 Encounter for screening mammogram for malignant neoplasm of breast: Secondary | ICD-10-CM

## 2020-09-15 ENCOUNTER — Ambulatory Visit
Admission: RE | Admit: 2020-09-15 | Discharge: 2020-09-15 | Disposition: A | Discharge status: Home / Self Care | Payer: Medicare HMO | Source: Ambulatory Visit | Attending: Internal Medicine | Admitting: Internal Medicine

## 2020-09-15 ENCOUNTER — Other Ambulatory Visit: Payer: Self-pay

## 2020-09-15 DIAGNOSIS — Z1231 Encounter for screening mammogram for malignant neoplasm of breast: Secondary | ICD-10-CM | POA: Insufficient documentation

## 2021-01-08 ENCOUNTER — Encounter: Payer: Self-pay | Admitting: Emergency Medicine

## 2021-01-08 ENCOUNTER — Emergency Department: Payer: Medicare HMO

## 2021-01-08 ENCOUNTER — Observation Stay: Payer: Medicare HMO | Admitting: Anesthesiology

## 2021-01-08 ENCOUNTER — Observation Stay
Admission: EM | Admit: 2021-01-08 | Discharge: 2021-01-09 | Disposition: A | Discharge status: Home / Self Care | Payer: Medicare HMO | Attending: Emergency Medicine | Admitting: Emergency Medicine

## 2021-01-08 ENCOUNTER — Other Ambulatory Visit: Payer: Self-pay

## 2021-01-08 ENCOUNTER — Encounter
Admission: EM | Disposition: A | Discharge status: Home / Self Care | Payer: Self-pay | Source: Home / Self Care | Attending: Emergency Medicine

## 2021-01-08 DIAGNOSIS — Z79899 Other long term (current) drug therapy: Secondary | ICD-10-CM | POA: Insufficient documentation

## 2021-01-08 DIAGNOSIS — K3532 Acute appendicitis with perforation and localized peritonitis, without abscess: Principal | ICD-10-CM | POA: Insufficient documentation

## 2021-01-08 DIAGNOSIS — Z20822 Contact with and (suspected) exposure to covid-19: Secondary | ICD-10-CM | POA: Diagnosis not present

## 2021-01-08 DIAGNOSIS — I10 Essential (primary) hypertension: Secondary | ICD-10-CM | POA: Insufficient documentation

## 2021-01-08 DIAGNOSIS — K353 Acute appendicitis with localized peritonitis, without perforation or gangrene: Secondary | ICD-10-CM | POA: Diagnosis present

## 2021-01-08 DIAGNOSIS — R1031 Right lower quadrant pain: Secondary | ICD-10-CM | POA: Diagnosis present

## 2021-01-08 DIAGNOSIS — K3580 Unspecified acute appendicitis: Secondary | ICD-10-CM

## 2021-01-08 HISTORY — PX: XI ROBOTIC LAPAROSCOPIC ASSISTED APPENDECTOMY: SHX6877

## 2021-01-08 LAB — URINALYSIS, COMPLETE (UACMP) WITH MICROSCOPIC
Bilirubin Urine: NEGATIVE
Glucose, UA: NEGATIVE mg/dL
Hgb urine dipstick: NEGATIVE
Ketones, ur: 5 mg/dL — AB
Leukocytes,Ua: NEGATIVE
Nitrite: NEGATIVE
Protein, ur: NEGATIVE mg/dL
Specific Gravity, Urine: 1.017 (ref 1.005–1.030)
pH: 7 (ref 5.0–8.0)

## 2021-01-08 LAB — CBC WITH DIFFERENTIAL/PLATELET
Abs Immature Granulocytes: 0.1 10*3/uL — ABNORMAL HIGH (ref 0.00–0.07)
Basophils Absolute: 0 10*3/uL (ref 0.0–0.1)
Basophils Relative: 0 %
Eosinophils Absolute: 0 10*3/uL (ref 0.0–0.5)
Eosinophils Relative: 0 %
HCT: 35.7 % — ABNORMAL LOW (ref 36.0–46.0)
Hemoglobin: 12 g/dL (ref 12.0–15.0)
Immature Granulocytes: 1 %
Lymphocytes Relative: 2 %
Lymphs Abs: 0.3 10*3/uL — ABNORMAL LOW (ref 0.7–4.0)
MCH: 30.2 pg (ref 26.0–34.0)
MCHC: 33.6 g/dL (ref 30.0–36.0)
MCV: 89.7 fL (ref 80.0–100.0)
Monocytes Absolute: 0.6 10*3/uL (ref 0.1–1.0)
Monocytes Relative: 4 %
Neutro Abs: 13.5 10*3/uL — ABNORMAL HIGH (ref 1.7–7.7)
Neutrophils Relative %: 93 %
Platelets: 129 10*3/uL — ABNORMAL LOW (ref 150–400)
RBC: 3.98 MIL/uL (ref 3.87–5.11)
RDW: 13.3 % (ref 11.5–15.5)
WBC: 14.5 10*3/uL — ABNORMAL HIGH (ref 4.0–10.5)
nRBC: 0 % (ref 0.0–0.2)

## 2021-01-08 LAB — CBC
HCT: 40.3 % (ref 36.0–46.0)
Hemoglobin: 13.9 g/dL (ref 12.0–15.0)
MCH: 29.6 pg (ref 26.0–34.0)
MCHC: 34.5 g/dL (ref 30.0–36.0)
MCV: 85.9 fL (ref 80.0–100.0)
Platelets: 161 10*3/uL (ref 150–400)
RBC: 4.69 MIL/uL (ref 3.87–5.11)
RDW: 13.2 % (ref 11.5–15.5)
WBC: 12.8 10*3/uL — ABNORMAL HIGH (ref 4.0–10.5)
nRBC: 0 % (ref 0.0–0.2)

## 2021-01-08 LAB — LIPASE, BLOOD: Lipase: 39 U/L (ref 11–51)

## 2021-01-08 LAB — COMPREHENSIVE METABOLIC PANEL
ALT: 14 U/L (ref 0–44)
AST: 20 U/L (ref 15–41)
Albumin: 4.4 g/dL (ref 3.5–5.0)
Alkaline Phosphatase: 60 U/L (ref 38–126)
Anion gap: 8 (ref 5–15)
BUN: 10 mg/dL (ref 8–23)
CO2: 23 mmol/L (ref 22–32)
Calcium: 9.5 mg/dL (ref 8.9–10.3)
Chloride: 106 mmol/L (ref 98–111)
Creatinine, Ser: 0.7 mg/dL (ref 0.44–1.00)
GFR, Estimated: >60 mL/min (ref >60)
Glucose, Bld: 166 mg/dL — ABNORMAL HIGH (ref 70–99)
Potassium: 3.9 mmol/L (ref 3.5–5.1)
Sodium: 137 mmol/L (ref 135–145)
Total Bilirubin: 1 mg/dL (ref 0.3–1.2)
Total Protein: 6.9 g/dL (ref 6.5–8.1)

## 2021-01-08 LAB — RESP PANEL BY RT-PCR (FLU A&B, COVID) ARPGX2
Influenza A by PCR: NEGATIVE
Influenza B by PCR: NEGATIVE
SARS Coronavirus 2 by RT PCR: NEGATIVE

## 2021-01-08 SURGERY — APPENDECTOMY, ROBOT-ASSISTED, LAPAROSCOPIC
Anesthesia: General

## 2021-01-08 MED ORDER — ONDANSETRON HCL 4 MG/2ML IJ SOLN
4.0000 mg | Freq: Once | INTRAMUSCULAR | Status: DC
  Filled 2021-01-08: qty 2

## 2021-01-08 MED ORDER — ONDANSETRON HCL 4 MG/2ML IJ SOLN
INTRAMUSCULAR | Status: AC
  Filled 2021-01-08: qty 2

## 2021-01-08 MED ORDER — DEXAMETHASONE SODIUM PHOSPHATE 10 MG/ML IJ SOLN
INTRAMUSCULAR | Status: DC | PRN
  Administered 2021-01-08: 10 mg via INTRAVENOUS

## 2021-01-08 MED ORDER — LIDOCAINE HCL (CARDIAC) PF 100 MG/5ML IV SOSY
PREFILLED_SYRINGE | INTRAVENOUS | Status: DC | PRN
  Administered 2021-01-08: 100 mg via INTRAVENOUS

## 2021-01-08 MED ORDER — ONDANSETRON 4 MG PO TBDP: 4.0000 mg | ORAL_TABLET | Freq: Four times a day (QID) | ORAL | Status: DC | PRN

## 2021-01-08 MED ORDER — METRONIDAZOLE 500 MG/100ML IV SOLN
500.0000 mg | Freq: Once | INTRAVENOUS | Status: AC
  Administered 2021-01-08: 500 mg via INTRAVENOUS
  Filled 2021-01-08: qty 100

## 2021-01-08 MED ORDER — SUCCINYLCHOLINE CHLORIDE 20 MG/ML IJ SOLN
INTRAMUSCULAR | Status: DC | PRN
  Administered 2021-01-08: 100 mg via INTRAVENOUS

## 2021-01-08 MED ORDER — MIDAZOLAM HCL 2 MG/2ML IJ SOLN
INTRAMUSCULAR | Status: DC | PRN
  Administered 2021-01-08: 2 mg via INTRAVENOUS

## 2021-01-08 MED ORDER — ENOXAPARIN SODIUM 40 MG/0.4ML IJ SOSY
40.0000 mg | PREFILLED_SYRINGE | INTRAMUSCULAR | Status: DC
  Administered 2021-01-09: 40 mg via SUBCUTANEOUS
  Filled 2021-01-08: qty 0.4

## 2021-01-08 MED ORDER — FENTANYL CITRATE (PF) 100 MCG/2ML IJ SOLN: 25.0000 ug | INTRAMUSCULAR | Status: DC | PRN

## 2021-01-08 MED ORDER — PROPOFOL 10 MG/ML IV BOLUS
INTRAVENOUS | Status: DC | PRN
  Administered 2021-01-08: 130 mg via INTRAVENOUS

## 2021-01-08 MED ORDER — MIDAZOLAM HCL 2 MG/2ML IJ SOLN
INTRAMUSCULAR | Status: AC
  Filled 2021-01-08: qty 2

## 2021-01-08 MED ORDER — MORPHINE SULFATE (PF) 2 MG/ML IV SOLN
2.0000 mg | Freq: Once | INTRAVENOUS | Status: DC
  Filled 2021-01-08: qty 1

## 2021-01-08 MED ORDER — PANTOPRAZOLE SODIUM 40 MG PO TBEC
40.0000 mg | DELAYED_RELEASE_TABLET | Freq: Every day | ORAL | Status: DC
  Administered 2021-01-09: 40 mg via ORAL
  Filled 2021-01-08: qty 1

## 2021-01-08 MED ORDER — GLYCOPYRROLATE 1 MG PO TABS: 8.0000 mg | ORAL_TABLET | Freq: Every day | ORAL | Status: DC

## 2021-01-08 MED ORDER — FENTANYL CITRATE (PF) 100 MCG/2ML IJ SOLN
INTRAMUSCULAR | Status: DC | PRN
  Administered 2021-01-08 (×2): 50 ug via INTRAVENOUS

## 2021-01-08 MED ORDER — ACETAMINOPHEN 650 MG RE SUPP: 650.0000 mg | Freq: Four times a day (QID) | RECTAL | Status: DC | PRN

## 2021-01-08 MED ORDER — FENTANYL CITRATE (PF) 100 MCG/2ML IJ SOLN
INTRAMUSCULAR | Status: AC
  Filled 2021-01-08: qty 2

## 2021-01-08 MED ORDER — PROPOFOL 10 MG/ML IV BOLUS
INTRAVENOUS | Status: AC
  Filled 2021-01-08: qty 20

## 2021-01-08 MED ORDER — ROCURONIUM BROMIDE 100 MG/10ML IV SOLN
INTRAVENOUS | Status: DC | PRN
  Administered 2021-01-08: 5 mg via INTRAVENOUS
  Administered 2021-01-08: 10 mg via INTRAVENOUS
  Administered 2021-01-08: 45 mg via INTRAVENOUS

## 2021-01-08 MED ORDER — SUGAMMADEX SODIUM 200 MG/2ML IV SOLN
INTRAVENOUS | Status: DC | PRN
  Administered 2021-01-08: 200 mg via INTRAVENOUS

## 2021-01-08 MED ORDER — MORPHINE SULFATE (PF) 4 MG/ML IV SOLN: 4.0000 mg | INTRAVENOUS | Status: DC | PRN

## 2021-01-08 MED ORDER — ROCURONIUM BROMIDE 10 MG/ML (PF) SYRINGE
PREFILLED_SYRINGE | INTRAVENOUS | Status: AC
  Filled 2021-01-08: qty 10

## 2021-01-08 MED ORDER — PROMETHAZINE HCL 25 MG/ML IJ SOLN: 6.2500 mg | INTRAMUSCULAR | Status: DC | PRN

## 2021-01-08 MED ORDER — 0.9 % SODIUM CHLORIDE (POUR BTL) OPTIME
TOPICAL | Status: DC | PRN
  Administered 2021-01-08: 500 mL

## 2021-01-08 MED ORDER — ACETAMINOPHEN 325 MG PO TABS: 650.0000 mg | ORAL_TABLET | Freq: Four times a day (QID) | ORAL | Status: DC | PRN

## 2021-01-08 MED ORDER — SUCRALFATE 1 G PO TABS: 1.0000 g | ORAL_TABLET | Freq: Three times a day (TID) | ORAL | Status: DC | PRN

## 2021-01-08 MED ORDER — BUPIVACAINE-EPINEPHRINE (PF) 0.5% -1:200000 IJ SOLN
INTRAMUSCULAR | Status: DC | PRN
  Administered 2021-01-08: 30 mL

## 2021-01-08 MED ORDER — METOPROLOL SUCCINATE ER 25 MG PO TB24
25.0000 mg | ORAL_TABLET | Freq: Every day | ORAL | Status: DC
  Administered 2021-01-09: 25 mg via ORAL
  Filled 2021-01-08: qty 1

## 2021-01-08 MED ORDER — ACETAMINOPHEN 10 MG/ML IV SOLN
INTRAVENOUS | Status: DC | PRN
  Administered 2021-01-08: 1000 mg via INTRAVENOUS

## 2021-01-08 MED ORDER — SODIUM CHLORIDE 0.9 % IV BOLUS
1000.0000 mL | Freq: Once | INTRAVENOUS | Status: AC
  Administered 2021-01-08: 1000 mL via INTRAVENOUS

## 2021-01-08 MED ORDER — CEFAZOLIN SODIUM-DEXTROSE 2-4 GM/100ML-% IV SOLN: 2.0000 g | Freq: Once | INTRAVENOUS | Status: DC

## 2021-01-08 MED ORDER — ACETAMINOPHEN 10 MG/ML IV SOLN
INTRAVENOUS | Status: AC
  Filled 2021-01-08: qty 100

## 2021-01-08 MED ORDER — ESCITALOPRAM OXALATE 10 MG PO TABS: 10.0000 mg | ORAL_TABLET | Freq: Every day | ORAL | Status: DC

## 2021-01-08 MED ORDER — ONDANSETRON HCL 4 MG/2ML IJ SOLN: 4.0000 mg | Freq: Four times a day (QID) | INTRAMUSCULAR | Status: DC | PRN

## 2021-01-08 MED ORDER — CLOZAPINE 100 MG PO TABS
550.0000 mg | ORAL_TABLET | Freq: Every day | ORAL | Status: DC
  Administered 2021-01-08: 550 mg via ORAL
  Filled 2021-01-08 (×2): qty 6

## 2021-01-08 MED ORDER — ONDANSETRON HCL 4 MG/2ML IJ SOLN
INTRAMUSCULAR | Status: DC | PRN
  Administered 2021-01-08: 4 mg via INTRAVENOUS

## 2021-01-08 MED ORDER — LIDOCAINE HCL (PF) 2 % IJ SOLN
INTRAMUSCULAR | Status: AC
  Filled 2021-01-08: qty 5

## 2021-01-08 MED ORDER — DEXAMETHASONE SODIUM PHOSPHATE 10 MG/ML IJ SOLN
INTRAMUSCULAR | Status: AC
  Filled 2021-01-08: qty 1

## 2021-01-08 MED ORDER — SODIUM CHLORIDE 0.9 % IV SOLN: INTRAVENOUS | Status: DC

## 2021-01-08 MED ORDER — PIPERACILLIN-TAZOBACTAM 3.375 G IVPB
3.3750 g | Freq: Three times a day (TID) | INTRAVENOUS | Status: DC
  Administered 2021-01-08 – 2021-01-09 (×3): 3.375 g via INTRAVENOUS
  Filled 2021-01-08 (×3): qty 50

## 2021-01-08 MED ORDER — HYDROCODONE-ACETAMINOPHEN 5-325 MG PO TABS
1.0000 | ORAL_TABLET | ORAL | Status: DC | PRN
  Administered 2021-01-08 – 2021-01-09 (×2): 2 via ORAL
  Filled 2021-01-08 (×2): qty 2

## 2021-01-08 SURGICAL SUPPLY — 61 items
BAG INFUSER PRESSURE 100CC (MISCELLANEOUS) IMPLANT
BLADE SURG SZ11 CARB STEEL (BLADE) ×2 IMPLANT
CANNULA REDUC XI 12-8 STAPL (CANNULA) ×2
CANNULA REDUCER 12-8 DVNC XI (CANNULA) ×1 IMPLANT
CHLORAPREP W/TINT 26 (MISCELLANEOUS) ×2 IMPLANT
COVER TIP SHEARS 8 DVNC (MISCELLANEOUS) ×1 IMPLANT
COVER TIP SHEARS 8MM DA VINCI (MISCELLANEOUS) ×2
DEFOGGER SCOPE WARMER CLEARIFY (MISCELLANEOUS) ×2 IMPLANT
DERMABOND ADVANCED (GAUZE/BANDAGES/DRESSINGS) ×1
DERMABOND ADVANCED .7 DNX12 (GAUZE/BANDAGES/DRESSINGS) ×1 IMPLANT
DRAPE ARM DVNC X/XI (DISPOSABLE) ×4 IMPLANT
DRAPE COLUMN DVNC XI (DISPOSABLE) ×1 IMPLANT
DRAPE DA VINCI XI ARM (DISPOSABLE) ×8
DRAPE DA VINCI XI COLUMN (DISPOSABLE) ×2
ELECT REM PT RETURN 9FT ADLT (ELECTROSURGICAL) ×2
ELECTRODE REM PT RTRN 9FT ADLT (ELECTROSURGICAL) ×1 IMPLANT
GAUZE 4X4 16PLY ~~LOC~~+RFID DBL (SPONGE) ×2 IMPLANT
GLOVE SURG SYN 6.5 ES PF (GLOVE) ×4 IMPLANT
GLOVE SURG UNDER POLY LF SZ6.5 (GLOVE) ×4 IMPLANT
GOWN STRL REUS W/ TWL LRG LVL3 (GOWN DISPOSABLE) ×3 IMPLANT
GOWN STRL REUS W/TWL LRG LVL3 (GOWN DISPOSABLE) ×6
GRASPER SUT TROCAR 14GX15 (MISCELLANEOUS) IMPLANT
IRRIGATOR SUCT 8 DISP DVNC XI (IRRIGATION / IRRIGATOR) IMPLANT
IRRIGATOR SUCTION 8MM XI DISP (IRRIGATION / IRRIGATOR)
IV NS 1000ML (IV SOLUTION)
IV NS 1000ML BAXH (IV SOLUTION) IMPLANT
KIT PINK PAD W/HEAD ARE REST (MISCELLANEOUS) ×2 IMPLANT
KIT PINK PAD W/HEAD ARM REST (MISCELLANEOUS) ×1 IMPLANT
LABEL OR SOLS (LABEL) IMPLANT
MANIFOLD NEPTUNE II (INSTRUMENTS) ×2 IMPLANT
NEEDLE HYPO 22GX1.5 SAFETY (NEEDLE) ×2 IMPLANT
NEEDLE INSUFFLATION 14GA 120MM (NEEDLE) ×2 IMPLANT
OBTURATOR OPTICAL STANDARD 8MM (TROCAR) ×2
OBTURATOR OPTICAL STND 8 DVNC (TROCAR) ×1
OBTURATOR OPTICALSTD 8 DVNC (TROCAR) ×1 IMPLANT
PACK LAP CHOLECYSTECTOMY (MISCELLANEOUS) ×2 IMPLANT
POUCH SPECIMEN RETRIEVAL 10MM (ENDOMECHANICALS) ×2 IMPLANT
RELOAD STAPLER 2.5X45 WHT DVNC (STAPLE) IMPLANT
RELOAD STAPLER 3.5X45 BLU DVNC (STAPLE) IMPLANT
SEAL CANN UNIV 5-8 DVNC XI (MISCELLANEOUS) ×3 IMPLANT
SEAL XI 5MM-8MM UNIVERSAL (MISCELLANEOUS) ×6
SEALER VESSEL DA VINCI XI (MISCELLANEOUS) ×2
SEALER VESSEL EXT DVNC XI (MISCELLANEOUS) ×1 IMPLANT
SET TUBE SMOKE EVAC HIGH FLOW (TUBING) ×2 IMPLANT
SOLUTION ELECTROLUBE (MISCELLANEOUS) ×2 IMPLANT
SPONGE T-LAP 4X18 ~~LOC~~+RFID (SPONGE) ×4 IMPLANT
STAPLER 45 DA VINCI SURE FORM (STAPLE)
STAPLER 45 SUREFORM DVNC (STAPLE) IMPLANT
STAPLER CANNULA SEAL DVNC XI (STAPLE) IMPLANT
STAPLER CANNULA SEAL XI (STAPLE)
STAPLER RELOAD 2.5X45 WHITE (STAPLE)
STAPLER RELOAD 2.5X45 WHT DVNC (STAPLE)
STAPLER RELOAD 3.5X45 BLU DVNC (STAPLE)
STAPLER RELOAD 3.5X45 BLUE (STAPLE)
SUT MNCRL AB 4-0 PS2 18 (SUTURE) ×2 IMPLANT
SUT VIC AB 2-0 SH 27 (SUTURE) ×2
SUT VIC AB 2-0 SH 27XBRD (SUTURE) ×1 IMPLANT
SUT VICRYL 0 AB UR-6 (SUTURE) ×2 IMPLANT
SUT VLOC 90 S/L VL9 GS22 (SUTURE) ×2 IMPLANT
SYR 30ML LL (SYRINGE) ×2 IMPLANT
TRAY FOLEY MTR SLVR 16FR STAT (SET/KITS/TRAYS/PACK) IMPLANT

## 2021-01-08 NOTE — ED Notes (Signed)
Surgeon at bedside. Pt covid swabbed.

## 2021-01-08 NOTE — ED Notes (Signed)
Pt went to CT and now back in room.

## 2021-01-08 NOTE — ED Notes (Signed)
Called OR to clarify if they are coming now for pt. They state they are waiting for covid results. They will call lab and call RN back.

## 2021-01-08 NOTE — Anesthesia Procedure Notes (Addendum)
Procedure Name: Intubation Date/Time: 01/08/2021 5:28 PM Performed by: Jonna Clark, CRNA Pre-anesthesia Checklist: Patient identified, Patient being monitored, Timeout performed, Emergency Drugs available and Suction available Patient Re-evaluated:Patient Re-evaluated prior to induction Oxygen Delivery Method: Circle system utilized Preoxygenation: Pre-oxygenation with 100% oxygen Induction Type: IV induction Ventilation: Mask ventilation without difficulty Laryngoscope Size: McGraph and 4 Grade View: Grade I Tube type: Oral Tube size: 7.0 mm Number of attempts: 1 Airway Equipment and Method: Stylet and Video-laryngoscopy Placement Confirmation: ETT inserted through vocal cords under direct vision, positive ETCO2 and breath sounds checked- equal and bilateral Secured at: 21 cm Tube secured with: Tape Dental Injury: Teeth and Oropharynx as per pre-operative assessment  Difficulty Due To: Difficulty was anticipated and Difficult Airway- due to anterior larynx

## 2021-01-08 NOTE — H&P (Signed)
SURGICAL CONSULTATION NOTE   HISTORY OF PRESENT ILLNESS (HPI):  65 y.o. female presented to [facility] ED for evaluation of abdominal pain. Patient reports started with paramedical abdominal pain last night.  The pain radiated to the right lower quadrant.  The pain has been intensifying.  Patient denies any alleviating or rating factors.  Patient denies any fever or chills.  At the ED she was found with leukocytosis of 12,000.  CT scan of the abdominal pelvis shows inflammation of the appendix with periappendiceal fat stranding.  I personally evaluated the images  Surgery is consulted by Dr. Archie Balboa in this context for evaluation and management of acute appendicitis.  PAST MEDICAL HISTORY (PMH):  Past Medical History:  Diagnosis Date   GERD (gastroesophageal reflux disease)    Hypertension    Schizophrenia ([place])      PAST SURGICAL HISTORY ([place]):  Past Surgical History:  Procedure Laterality Date   BREAST BIOPSY Left 2002   Negative c Clip   COLONOSCOPY N/A 02/21/2015   Procedure: COLONOSCOPY;  Surgeon: Josefine Class, MD;  Location: [facility] ENDOSCOPY;  Service: Endoscopy;  Laterality: N/A;   COLONOSCOPY WITH PROPOFOL N/A 03/18/2020   Procedure: COLONOSCOPY WITH PROPOFOL;  Surgeon: Lesly Rubenstein, MD;  Location: [facility] ENDOSCOPY;  Service: Endoscopy;  Laterality: N/A;   ESOPHAGOGASTRODUODENOSCOPY (EGD) WITH PROPOFOL N/A 03/18/2020   Procedure: ESOPHAGOGASTRODUODENOSCOPY (EGD) WITH PROPOFOL;  Surgeon: Lesly Rubenstein, MD;  Location: [facility] ENDOSCOPY;  Service: Endoscopy;  Laterality: N/A;     MEDICATIONS:  Prior to Admission medications   Medication Sig Start Date End Date Taking? Authorizing Provider  acetaminophen (TYLENOL) 325 MG tablet Take 325 mg by mouth every 6 (six) hours as needed.    [provider]  benztropine (COGENTIN) 0.5 MG tablet Take 0.5 mg by mouth 3 (three) times daily as needed.  Patient not taking: No sig reported    [provider]   cefUROXime (CEFTIN) 500 MG tablet Take 1 tablet (500 mg total) by mouth 2 (two) times daily with a meal. Patient not taking: No sig reported 07/29/16   Bettey Costa, MD  cloZAPine (CLOZARIL) 25 MG tablet Take 525 mg by mouth at bedtime.     [provider]  ergocalciferol (VITAMIN D2) 50000 UNITS capsule Take 50,000 Units by mouth once a week. Patient not taking: No sig reported    [provider]  escitalopram (LEXAPRO) 10 MG tablet Take 10 mg by mouth daily.    [provider]  glycopyrrolate (ROBINUL) 2 MG tablet Take 8 mg by mouth at bedtime.     [provider]  metoprolol succinate (TOPROL-XL) 25 MG 24 hr tablet Take 25 mg by mouth daily. Take with or immediately following a meal.     [provider]  omeprazole (PRILOSEC) 20 MG capsule Take 20 mg by mouth daily. Patient not taking: No sig reported    [provider]  pantoprazole (PROTONIX) 40 MG tablet Take 40 mg by mouth daily.    [provider]  pyridOXINE (VITAMIN B-6) 100 MG tablet Take 100 mg by mouth daily.    [provider]  sucralfate (CARAFATE) 1 g tablet Take 1 g by mouth 4 (four) times daily -  with meals and at bedtime.    [provider]  vitamin B-12 (CYANOCOBALAMIN) 100 MCG tablet Take 100 mcg by mouth daily.    [provider]     ALLERGIES:  Allergies  Allergen Reactions   Codeine  SOCIAL HISTORY:  Social History   Socioeconomic History   Marital status: Divorced    Spouse name: Not on file   Number of children: Not on file   Years of education: Not on file   Highest education level: Not on file  Occupational History   Not on file  Tobacco Use   Smoking status: Never   Smokeless tobacco: Never  Vaping Use   Vaping Use: Never used  Substance and Sexual Activity   Alcohol use: Not Currently   Drug use: No   Sexual activity: Not on file  Other Topics Concern   Not on file  Social History Narrative   Not on  file   Social Determinants of Health   Financial Resource Strain: Not on file  Food Insecurity: Not on file  Transportation Needs: Not on file  Physical Activity: Not on file  Stress: Not on file  Social Connections: Not on file  Intimate Partner Violence: Not on file      FAMILY HISTORY:  Family History  Problem Relation Age of Onset   Breast cancer Sister 65   Breast cancer Paternal Grandmother      REVIEW OF SYSTEMS:  Constitutional: denies weight loss, fever, chills, or sweats  Eyes: denies any other vision changes, history of eye injury  ENT: denies sore throat, hearing problems  Respiratory: denies shortness of breath, wheezing  Cardiovascular: denies chest pain, palpitations  Gastrointestinal: positive abdominal pain, nausea and vomiting Genitourinary: denies burning with urination or urinary frequency Musculoskeletal: denies any other joint pains or cramps  Skin: denies any other rashes or skin discolorations  Neurological: denies any other headache, dizziness, weakness  Psychiatric: denies any other depression, anxiety   All other review of systems were negative   VITAL SIGNS:  Temp:  [98 F (36.7 C)-98.6 F (37 C)] 98.6 F (37 C) (07/07 1239) Pulse Rate:  [101-105] 101 (07/07 1400) Resp:  [17-21] 21 (07/07 1400) BP: (95-140)/(69-93) 140/70 (07/07 1400) SpO2:  [95 %-98 %] 97 % (07/07 1400) Weight:  [93.9 kg] 93.9 kg (07/07 1140)     Height: 5\' 6"  (167.6 cm) Weight: 93.9 kg BMI (Calculated): 33.43   INTAKE/OUTPUT:  This shift: No intake/output data recorded.  Last 2 shifts: @IOLAST2SHIFTS @   PHYSICAL EXAM:  Constitutional:  -- Normal body habitus  -- Awake, alert, and oriented x3  Eyes:  -- Pupils equally round and reactive to light  -- No scleral icterus  Ear, nose, and throat:  -- No jugular venous distension  Pulmonary:  -- No crackles  -- Equal breath sounds bilaterally -- Breathing non-labored at rest Cardiovascular:  -- S1, S2 present   -- No pericardial rubs Gastrointestinal:  -- Abdomen soft, tender in the right lower quadrant, non-distended, no guarding or rebound tenderness -- No abdominal masses appreciated, pulsatile or otherwise  Musculoskeletal and Integumentary:  -- Wounds or skin discoloration: None appreciated -- Extremities: B/L UE and LE FROM, hands and feet warm, no edema  Neurologic:  -- Motor function: intact and symmetric -- Sensation: intact and symmetric   Labs:  CBC Latest Ref Rng & Units 01/08/2021 07/29/2016 07/28/2016  WBC 4.0 - 10.5 K/uL 12.8(H) 5.7 7.0  Hemoglobin 12.0 - 15.0 g/dL 13.9 12.4 13.8  Hematocrit 36.0 - 46.0 % 40.3 34.7(L) 38.8  Platelets 150 - 400 K/uL 161 150 171   CMP Latest Ref Rng & Units 01/08/2021 07/29/2016 07/28/2016  Glucose 70 - 99 mg/dL 166(H) 119(H) 83  BUN 8 - 23 mg/dL 10  15 16  Creatinine 0.44 - 1.00 mg/dL 0.70 0.79 0.83  Sodium 135 - 145 mmol/L 137 138 137  Potassium 3.5 - 5.1 mmol/L 3.9 3.6 4.1  Chloride 98 - 111 mmol/L 106 106 105  CO2 22 - 32 mmol/L 23 27 23   Calcium 8.9 - 10.3 mg/dL 9.5 8.3(L) 8.9  Total Protein 6.5 - 8.1 g/dL 6.9 - 6.8  Total Bilirubin 0.3 - 1.2 mg/dL 1.0 - 1.1  Alkaline Phos 38 - 126 U/L 60 - 46  AST 15 - 41 U/L 20 - 21  ALT 0 - 44 U/L 14 - 14     Imaging studies:  EXAM: CT ABDOMEN AND PELVIS WITHOUT CONTRAST   TECHNIQUE: Multidetector CT imaging of the abdomen and pelvis was performed following the standard protocol without IV contrast.   COMPARISON:  None.   FINDINGS: Lower chest: Small sliding-type hiatal hernia. Visualized lung bases are unremarkable.   Hepatobiliary: No focal liver abnormality is seen. Status post cholecystectomy. No biliary dilatation.   Pancreas: Unremarkable. No pancreatic ductal dilatation or surrounding inflammatory changes.   Spleen: Mild splenomegaly is noted.   Adrenals/Urinary Tract: Adrenal glands appear normal. Small nonobstructive left renal calculus is noted. No hydronephrosis or renal  obstruction is noted. Urinary bladder is unremarkable.   Stomach/Bowel: The stomach appears normal. There is no evidence of bowel obstruction. The appendix is enlarged measuring 12 mm with mild surrounding inflammatory changes. It is thick walled in portions, although it does contain gas in other areas.   Vascular/Lymphatic: No significant vascular findings are present. No enlarged abdominal or pelvic lymph nodes.   Reproductive: Uterus and bilateral adnexa are unremarkable.   Other: No abdominal wall hernia or abnormality. No abdominopelvic ascites.   Musculoskeletal: No acute or significant osseous findings.   IMPRESSION: The appendix is enlarged with mild surrounding inflammatory changes, and it does appear to be thick walled in some areas although it does contain gas in other areas. These findings are equivocal for appendicitis, and laboratory and clinical correlation is recommended.   Small sliding-type hiatal hernia.   Small nonobstructive left renal calculus. No hydronephrosis or renal obstruction is noted.     Electronically Signed   By: Marijo Conception M.D.   On: 01/08/2021 14:54    Assessment/Plan:  65 y.o. female with acute appendicitis, complicated by pertinent comorbidities including hypertension, history of schizophrenia.  Patient with history, physical exam and images consistent with acute appendicitis. Patient oriented about diagnosis and surgical management as treatment. Patient oriented about goals of surgery and its risk including: bowel injury, infection, abscess, bleeding, leak from cecum, intestinal adhesions, bowel obstruction, fistula, injury to the ureter among others.  Patient understood and agreed to proceed with surgery. Will admit patient, already started on antibiotic therapy, will give IV hydration since patient is NPO and schedule to OR.   Arnold Long, MD

## 2021-01-08 NOTE — ED Notes (Signed)
Informed RN bed assigned 

## 2021-01-08 NOTE — ED Triage Notes (Signed)
Pt c/o lower abd pain with nausea and diarrhea for the past couple of days.

## 2021-01-08 NOTE — Anesthesia Postprocedure Evaluation (Signed)
Anesthesia Post Note  Patient: Paula Reid  Procedure(s) Performed: XI ROBOTIC LAPAROSCOPIC ASSISTED APPENDECTOMY  Patient location during evaluation: PACU Anesthesia Type: General Level of consciousness: awake and alert Pain management: pain level controlled Vital Signs Assessment: post-procedure vital signs reviewed and stable Respiratory status: spontaneous breathing, nonlabored ventilation, respiratory function stable and patient connected to nasal cannula oxygen Cardiovascular status: blood pressure returned to baseline and stable Postop Assessment: no apparent nausea or vomiting Anesthetic complications: no   No notable events documented.   Last Vitals:  Vitals:   01/08/21 1949 01/08/21 2054  BP: 106/61 126/61  Pulse: 86 78  Resp: 18 18  Temp: 37.1 C 36.7 C  SpO2: 95% 96%    Last Pain:  Vitals:   01/08/21 1949  TempSrc: Oral  PainSc:                  Martha Clan

## 2021-01-08 NOTE — Transfer of Care (Signed)
Immediate Anesthesia Transfer of Care Note  Patient: Paula Reid  Procedure(s) Performed: XI ROBOTIC LAPAROSCOPIC ASSISTED APPENDECTOMY  Patient Location: PACU  Anesthesia Type:General  Level of Consciousness: awake, alert  and oriented  Airway & Oxygen Therapy: Patient Spontanous Breathing and Patient connected to face mask oxygen  Post-op Assessment: Report given to RN and Post -op Vital signs reviewed and stable  Post vital signs: Reviewed and stable  Last Vitals:  Vitals Value Taken Time  BP 111/58   Temp    Pulse 106 01/08/21 1834  Resp 21 01/08/21 1834  SpO2 100 % 01/08/21 1834  Vitals shown include unvalidated device data.  Last Pain:  Vitals:   01/08/21 1711  TempSrc: Temporal  PainSc: 0-No pain      Patients Stated Pain Goal: 0 (34/03/52 4818)  Complications: No notable events documented.

## 2021-01-08 NOTE — Anesthesia Preprocedure Evaluation (Signed)
Anesthesia Evaluation  Patient identified by MRN, date of birth, ID band Patient awake    Reviewed: Allergy & Precautions, H&P , NPO status , Patient's Chart, lab work & pertinent test results  History of Anesthesia Complications Negative for: history of anesthetic complications  Airway Mallampati: II  TM Distance: >3 FB     Dental  (+) Chipped, Dental Advidsory Given, Caps, Teeth Intact   Pulmonary neg pulmonary ROS, neg shortness of breath, neg sleep apnea, neg COPD, neg recent URI,    breath sounds clear to auscultation       Cardiovascular hypertension, (-) angina(-) Past MI and (-) Cardiac Stents (-) dysrhythmias (-) Valvular Problems/Murmurs Rhythm:regular Rate:Normal     Neuro/Psych PSYCHIATRIC DISORDERS Schizophrenia negative neurological ROS     GI/Hepatic Neg liver ROS, GERD  Controlled,  Endo/Other  negative endocrine ROS  Renal/GU negative Renal ROS  negative genitourinary   Musculoskeletal   Abdominal   Peds  Hematology negative hematology ROS (+)   Anesthesia Other Findings N/V today  Past Medical History: No date: GERD (gastroesophageal reflux disease) No date: Hypertension No date: Schizophrenia [facility])  Past Surgical History: 2002: BREAST BIOPSY; Left     Comment:  Negative c Clip 02/21/2015: COLONOSCOPY; N/A     Comment:  Procedure: COLONOSCOPY;  Surgeon: Josefine Class,               MD;  Location: [facility] ENDOSCOPY;  Service: Endoscopy;                Laterality: N/A;  BMI    Body Mass Index: 31.64 kg/m      Reproductive/Obstetrics negative OB ROS                             Anesthesia Physical  Anesthesia Plan  ASA: 2  Anesthesia Plan: General ETT   Post-op Pain Management:    Induction: Intravenous  PONV Risk Score and Plan: Ondansetron, Dexamethasone, Midazolam and Treatment may vary due to age or medical condition  Airway Management Planned:  Oral ETT  Additional Equipment:   Intra-op Plan:   Post-operative Plan: Extubation in OR  Informed Consent: I have reviewed the patients History and Physical, chart, labs and discussed the procedure including the risks, benefits and alternatives for the proposed anesthesia with the patient or authorized representative who has indicated his/her understanding and acceptance.     Dental Advisory Given  Plan Discussed with: Anesthesiologist, CRNA and Surgeon  Anesthesia Plan Comments:         Anesthesia Quick Evaluation

## 2021-01-08 NOTE — Consult Note (Signed)
Pharmacy - Clozapine     This patient's order has been reviewed for prescribing contraindications.   Clozapine REMS enrollment Verified: not found Current Outpatient Monitoring: n/a  Home Regimen:  Clozapine 550 mg qHS  Dose Adjustments This Admission: N/a  Labs:   01/08/21 [place] [id number]   Plan: Continue home regimen of 550 mg qHS  Continue with weekly ANC labs while inpatient  **The medication is being dispensed pursuant to the FDA REMS suspension order of 05/23/20 that allows for dispensing without a patient REMS dispense authorization (RDA).    Dorothe Pea, PharmD, BCPS Clinical Pharmacist

## 2021-01-08 NOTE — ED Notes (Signed)
PA at bedside with pt.

## 2021-01-08 NOTE — ED Provider Notes (Signed)
[facility] Emergency Department Provider Note  ____________________________________________   Event Date/Time   First MD Initiated Contact with Patient 01/08/21 1248     (approximate)  I have reviewed the triage vital signs and the nursing notes.   HISTORY  Chief Complaint Abdominal Pain  HPI Paula Reid is a 65 y.o. female the below medical history including schizophrenia and hypertension, presents to the ED for evaluation of right lower quadrant pain.  Patient describes 8 out of 10 pain to the right lower quadrant that awoke her early this morning.  She reports anorexia and nausea without associated vomiting.  She also notes nonbloody diarrhea for the last couple days.  She denies any fevers, chill,s sweats, sick contacts, recent travel, or other bad food exposure.     Past Medical History:  Diagnosis Date   GERD (gastroesophageal reflux disease)    Hypertension    Schizophrenia ([place])     Patient Active Problem List   Diagnosis Date Noted   UTI (urinary tract infection) 07/28/2016   Acute encephalopathy 07/28/2016   Schizophrenia ([place]) 07/28/2016   HTN (hypertension) 07/28/2016   GERD (gastroesophageal reflux disease) 07/28/2016    Past Surgical History:  Procedure Laterality Date   BREAST BIOPSY Left 2002   Negative c Clip   COLONOSCOPY N/A 02/21/2015   Procedure: COLONOSCOPY;  Surgeon: Josefine Class, MD;  Location: [facility] ENDOSCOPY;  Service: Endoscopy;  Laterality: N/A;   COLONOSCOPY WITH PROPOFOL N/A 03/18/2020   Procedure: COLONOSCOPY WITH PROPOFOL;  Surgeon: Lesly Rubenstein, MD;  Location: [facility] ENDOSCOPY;  Service: Endoscopy;  Laterality: N/A;   ESOPHAGOGASTRODUODENOSCOPY (EGD) WITH PROPOFOL N/A 03/18/2020   Procedure: ESOPHAGOGASTRODUODENOSCOPY (EGD) WITH PROPOFOL;  Surgeon: Lesly Rubenstein, MD;  Location: [facility] ENDOSCOPY;  Service: Endoscopy;  Laterality: N/A;    Prior to Admission medications   Medication Sig Start  Date End Date Taking? Authorizing Provider  acetaminophen (TYLENOL) 325 MG tablet Take 325 mg by mouth every 6 (six) hours as needed.    [provider]  benztropine (COGENTIN) 0.5 MG tablet Take 0.5 mg by mouth 3 (three) times daily as needed.  Patient not taking: Reported on 03/18/2020    [provider]  cefUROXime (CEFTIN) 500 MG tablet Take 1 tablet (500 mg total) by mouth 2 (two) times daily with a meal. Patient not taking: Reported on 03/18/2020 07/29/16   Bettey Costa, MD  cloZAPine (CLOZARIL) 25 MG tablet Take 525 mg by mouth at bedtime.     [provider]  ergocalciferol (VITAMIN D2) 50000 UNITS capsule Take 50,000 Units by mouth once a week. Patient not taking: Reported on 03/18/2020    [provider]  escitalopram (LEXAPRO) 10 MG tablet Take 10 mg by mouth daily.    [provider]  glycopyrrolate (ROBINUL) 2 MG tablet Take 8 mg by mouth at bedtime.     [provider]  metoprolol succinate (TOPROL-XL) 25 MG 24 hr tablet Take 25 mg by mouth daily. Take with or immediately following a meal.     [provider]  omeprazole (PRILOSEC) 20 MG capsule Take 20 mg by mouth daily. Patient not taking: Reported on 03/18/2020    [provider]  pantoprazole (PROTONIX) 40 MG tablet Take 40 mg by mouth daily.    [provider]  pyridOXINE (VITAMIN B-6) 100 MG tablet Take 100 mg by mouth daily.    [provider]  sucralfate (CARAFATE) 1 g tablet Take 1 g by mouth 4 (four)  times daily -  with meals and at bedtime.    [provider]  vitamin B-12 (CYANOCOBALAMIN) 100 MCG tablet Take 100 mcg by mouth daily.    [provider]    Allergies Codeine  Family History  Problem Relation Age of Onset   Breast cancer Sister 68   Breast cancer Paternal Grandmother     Social History Social History   Tobacco Use   Smoking status: Never   Smokeless tobacco: Never  Vaping Use   Vaping Use:  Never used  Substance Use Topics   Alcohol use: Not Currently   Drug use: No    Review of Systems  Constitutional: No fever/chills Eyes: No visual changes. ENT: No sore throat. Cardiovascular: Denies chest pain. Respiratory: Denies shortness of breath. Gastrointestinal: Reports abdominal pain and nausea, no vomiting.  Reports diarrhea.  No constipation. Genitourinary: Negative for dysuria. Musculoskeletal: Negative for back pain. Skin: Negative for rash. Neurological: Negative for headaches, focal weakness or numbness. ____________________________________________   PHYSICAL EXAM:  VITAL SIGNS: ED Triage Vitals  Enc Vitals Group     BP 01/08/21 1143 95/69     Pulse Rate 01/08/21 1143 (!) 103     Resp 01/08/21 1143 18     Temp 01/08/21 1143 98 F (36.7 C)     Temp Source 01/08/21 1143 Oral     SpO2 01/08/21 1143 95 %     Weight 01/08/21 1140 207 lb (93.9 kg)     Height 01/08/21 1140 5\' 6"  (1.676 m)     Head Circumference --      Peak Flow --      Pain Score 01/08/21 1140 8     Pain Loc --      Pain Edu? --      Excl. in [place]? --     Constitutional: Alert and oriented. Well appearing and in no acute distress. Eyes: Conjunctivae are normal. PERRL. EOMI. Head: Atraumatic. Nose: No congestion/rhinnorhea. Mouth/Throat: Mucous membranes are moist.  Oropharynx non-erythematous. Neck: No stridor.   Cardiovascular: Normal rate, regular rhythm. Grossly normal heart sounds.  Good peripheral circulation. Respiratory: Normal respiratory effort.  No retractions. Lungs CTAB. Gastrointestinal: Soft and mildly tender over the RLQ. No distention. No abdominal bruits. No CVA tenderness. Musculoskeletal: No lower extremity tenderness nor edema.  No joint effusions. Neurologic:  Normal speech and language. No gross focal neurologic deficits are appreciated. No gait instability. Skin:  Skin is warm, dry and intact. No rash noted. Psychiatric: Mood and affect are normal. Speech and  behavior are normal.  ____________________________________________   LABS (all labs ordered are listed, but only abnormal results are displayed)  Labs Reviewed  COMPREHENSIVE METABOLIC PANEL - Abnormal; Notable for the following components:      Result Value   Glucose, Bld 166 (*)    All other components within normal limits  CBC - Abnormal; Notable for the following components:   WBC 12.8 (*)    All other components within normal limits  URINALYSIS, COMPLETE (UACMP) WITH MICROSCOPIC - Abnormal; Notable for the following components:   Color, Urine YELLOW (*)    APPearance HAZY (*)    Ketones, ur 5 (*)    Bacteria, UA RARE (*)    All other components within normal limits  RESP PANEL BY RT-PCR (FLU A&B, COVID) ARPGX2  LIPASE, BLOOD   ____________________________________________  EKG   ____________________________________________  RADIOLOGY I, Melvenia Needles, personally viewed and evaluated these images (plain radiographs) as part of my medical decision  making, as well as reviewing the written report by the radiologist.  ED MD interpretation:  agree with report  Official radiology report(s): CT ABDOMEN PELVIS WO CONTRAST  Result Date: 01/08/2021 CLINICAL DATA:  Acute right lower quadrant abdominal pain. EXAM: CT ABDOMEN AND PELVIS WITHOUT CONTRAST TECHNIQUE: Multidetector CT imaging of the abdomen and pelvis was performed following the standard protocol without IV contrast. COMPARISON:  None. FINDINGS: Lower chest: Small sliding-type hiatal hernia. Visualized lung bases are unremarkable. Hepatobiliary: No focal liver abnormality is seen. Status post cholecystectomy. No biliary dilatation. Pancreas: Unremarkable. No pancreatic ductal dilatation or surrounding inflammatory changes. Spleen: Mild splenomegaly is noted. Adrenals/Urinary Tract: Adrenal glands appear normal. Small nonobstructive left renal calculus is noted. No hydronephrosis or renal obstruction is noted. Urinary  bladder is unremarkable. Stomach/Bowel: The stomach appears normal. There is no evidence of bowel obstruction. The appendix is enlarged measuring 12 mm with mild surrounding inflammatory changes. It is thick walled in portions, although it does contain gas in other areas. Vascular/Lymphatic: No significant vascular findings are present. No enlarged abdominal or pelvic lymph nodes. Reproductive: Uterus and bilateral adnexa are unremarkable. Other: No abdominal wall hernia or abnormality. No abdominopelvic ascites. Musculoskeletal: No acute or significant osseous findings. IMPRESSION: The appendix is enlarged with mild surrounding inflammatory changes, and it does appear to be thick walled in some areas although it does contain gas in other areas. These findings are equivocal for appendicitis, and laboratory and clinical correlation is recommended. Small sliding-type hiatal hernia. Small nonobstructive left renal calculus. No hydronephrosis or renal obstruction is noted. Electronically Signed   By: Marijo Conception M.D.   On: 01/08/2021 14:54    ____________________________________________   PROCEDURES  Procedure(s) performed (including Critical Care):  Procedures  NS 1000 ml bolus Morphine 2 mg IVP Zofran 4 mg IVP ____________________________________________   INITIAL IMPRESSION / ASSESSMENT AND PLAN / ED COURSE  As part of my medical decision making, I reviewed the following data within the [place] reviewed as noted, Radiograph reviewed as noted, Discussed with admitting physician D. Cintron, MD, and Notes from prior ED visits   Differential diagnosis includes, but is not limited to, ovarian cyst, ovarian torsion, acute appendicitis, diverticulitis, urinary tract infection/pyelonephritis, endometriosis, bowel obstruction, colitis, renal colic, gastroenteritis, hernia, fibroids, endometriosis, pregnancy related pain including ectopic pregnancy, etc.  Patient ED  evaluation of sudden onset of right lower quadrant pain the early morning hours.  She notes associated nausea without vomiting.  She was evaluated for complaints in the ED, and found to have tenderness on abdominal exam to the right lower quadrant.  She also noted to have 12.8 white count.  Patient CT scan reveals an appendix with thickened walls, concerning for an acute appendicitis.  Patient will be admitted to the surgery service for subsequent laparotomy.  Patient understands the diagnoses and plan of care at this time, and consents to admission and and further management. ----------------------------------------- 3:13 PM on 01/08/2021 ----------------------------------------- S/W Dr. Peyton Najjar: he will evaluate the patient for admission and surgical intervention.   ____________________________________________   FINAL CLINICAL IMPRESSION(S) / ED DIAGNOSES  Final diagnoses:  Acute appendicitis, unspecified acute appendicitis type     ED Discharge Orders     None        Note:  This document was prepared using Dragon voice recognition software and may include unintentional dictation errors.    Melvenia Needles, PA-C 01/08/21 1516    Nance Pear, MD 01/09/21 [phone]

## 2021-01-08 NOTE — ED Notes (Signed)
OR nurse called lab who said covid will result "any minute now" and then they will come for pt.

## 2021-01-08 NOTE — Op Note (Signed)
Pre-op Diagnosis: Acute appendicitis   Post op Diagnosis: Acute perforated appendicitis  Procedure: Robotic assisted laparoscopic appendectomy.  Anesthesia: GETA  Surgeon: Herbert Pun, MD, FACS  Wound Classification: clean contaminated  Specimen: Appendix  Complications: None  Estimated Blood Loss: 3 mL   Indications: Patient is a 65 y.o. female  presented with above right lower quadrant pain. CT scan shows acute appendicitis.     FIndings: 1.  Perforated gangrenous appendicitis with purulent peritonitis. Infection in the right lower quadrant and pelvis.    2. Normal anatomy 3. Adequate hemostasis.   Description of procedure: The patient was placed on the operating table in the supine position. General anesthesia was induced. A time-out was completed verifying correct patient, procedure, site, positioning, and implant(s) and/or special equipment prior to beginning this procedure. The abdomen was prepped and draped in the usual sterile fashion.   Palmer's point located and Veress needle was inserted.  After confirming 2 clicks and a positive saline drop test, gas insufflation was initiated until the abdominal pressure was measured at 15 mmHg.  Afterwards, the Veress needle was removed and a 8 mm port was placed in left upper quadrant area using Optiview technique.  After local was infused, 3 additional incision on the left abdominal wall were made 5 cm apart.  An 12 mm port and two other 8 mm ports were placed under direct visualization.  No injuries from trocar placements were noted.  The table was placed in the Trendelenburg position with the right side elevated.  With the use of Tip up grasper, Force Bipolar and Vessel sealer, an inflamed appendix was identified and elevated.  Window created at base of appendix in the mesentery.    Mesoappendix was divided with Vessel sealer. The base of the appendix was ligated with 2-0 Vloc and the appendix divided with vessel sealer.  Closure of the appendix orifice was reinforced with a second layer of 2-0 Vloc. The appendix was placed in an endoscopic retrieval bag and removed.   The appendiceal stump was examined and hemostasis noted. No other pathology was identified within pelvis. The 12 mm trocar removed and port site closed with PMI using 0 vicryl under direct vision. Remaining trocars were removed under direct vision. No bleeding was noted.The abdomen was allowed to collapse.  All skin incisions then closed with subcuticular sutures Monocryl 4-0.  Wounds then dressed with dermabond.  The patient tolerated the procedure well, awakened from anesthesia and was taken to the postanesthesia care unit in satisfactory condition.  Sponge count and instrument count correct at the end of the procedure.

## 2021-01-08 NOTE — ED Notes (Signed)
Pt to ED for abdominal pain, vomiting and diarrhea. Diarrhea started 2 days ago (3 times/day), nausea and abdominal pain started this morning. Pt just now vomited for first time, green bile.  Denies cough, SOB, fevers, CP. Denies known covid exposure.  Ambulatory to ED room.

## 2021-01-09 ENCOUNTER — Encounter: Payer: Self-pay | Admitting: General Surgery

## 2021-01-09 MED ORDER — NABUMETONE 500 MG PO TABS: 500.0000 mg | ORAL_TABLET | Freq: Two times a day (BID) | ORAL | 0 refills | Status: AC

## 2021-01-09 NOTE — Discharge Instructions (Signed)
Diet: Resume home heart healthy regular diet.  ° °Activity: No heavy lifting >20 pounds (children, pets, laundry, garbage) or strenuous activity until follow-up, but light activity and walking are encouraged. Do not drive or drink alcohol if taking narcotic pain medications. ° °Wound care: May shower with soapy water and pat dry (do not rub incisions), but no baths or submerging incision underwater until follow-up. (no swimming)  ° °Medications: Resume all home medications. For mild to moderate pain: acetaminophen (Tylenol) or ibuprofen (if no kidney disease). Combining Tylenol with alcohol can substantially increase your risk of causing liver disease.  ° °Call office ([phone]) at any time if any questions, worsening pain, fevers/chills, bleeding, drainage from incision site, or other concerns. ° °

## 2021-01-09 NOTE — Discharge Summary (Signed)
  Patient ID: Paula Reid MRN: [id number] DOB/AGE: 65-06-1956 65 y.o.  Admit date: 01/08/2021 Discharge date: 01/09/2021   Discharge Diagnoses:  Active Problems:   Acute appendicitis with localized peritonitis   Procedures: Robotic assisted laparoscopic appendectomy  Hospital Course: Patient underwent robotic assisted laparoscopic appendectomy for perforated appendicitis.  Patient tolerated the procedure well.  This morning patient without fever.  Patient without significant pain.  Adequate vital sign.  Patient tolerating diet.  Physical Exam Cardiovascular:     Rate and Rhythm: Normal rate and regular rhythm.     Heart sounds: Normal heart sounds.  Pulmonary:     Effort: Pulmonary effort is normal.  Abdominal:     General: Abdomen is flat. Bowel sounds are normal.     Palpations: Abdomen is soft.     Tenderness: There is no abdominal tenderness.  Skin:    General: Skin is warm.  Neurological:     Mental Status: She is alert and oriented to person, place, and time.  Wounds are dry and clean   Consults: None  Disposition: Discharge disposition: 01-Home or Self Care       Discharge Instructions     Diet - low sodium heart healthy   Complete by: As directed    Increase activity slowly   Complete by: As directed       Allergies as of 01/09/2021       Reactions   Codeine Other (See Comments)   Other reaction(s): Seizure seizure        Medication List     TAKE these medications    acetaminophen 325 MG tablet Commonly known as: TYLENOL Take 325 mg by mouth every 6 (six) hours as needed.   Cholecalciferol 25 MCG (1000 UT) tablet Take 2 tablets by mouth daily.   cloZAPine 100 MG tablet Commonly known as: CLOZARIL Take 550 mg by mouth at bedtime.   escitalopram 10 MG tablet Commonly known as: LEXAPRO Take 10 mg by mouth daily.   metoprolol succinate 25 MG 24 hr tablet Commonly known as: TOPROL-XL Take 25 mg by mouth daily. Take with or  immediately following a meal.   miconazole 2 % powder Commonly known as: MICOTIN Apply 1 application topically daily as needed for rash.   nabumetone 500 MG tablet Commonly known as: RELAFEN Take 1 tablet (500 mg total) by mouth 2 (two) times daily for 7 days.   pantoprazole 40 MG tablet Commonly known as: PROTONIX Take 40 mg by mouth daily.   sucralfate 1 g tablet Commonly known as: CARAFATE Take 1 g by mouth 4 (four) times daily -  with meals and at bedtime.   vitamin B-12 100 MCG tablet Commonly known as: CYANOCOBALAMIN Take 100 mcg by mouth daily.       ASK your doctor about these medications    ergocalciferol 1.25 MG (50000 UT) capsule Commonly known as: VITAMIN D2 Take 50,000 Units by mouth once a week.   pyridOXINE 100 MG tablet Commonly known as: VITAMIN B-6 Take 100 mg by mouth daily.        Follow-up Information     Herbert Pun, MD Follow up in 2 week(s).   Specialty: General Surgery Why: follow up after appendectomy Contact information: [place] [address] [phone]

## 2021-01-12 LAB — SURGICAL PATHOLOGY

## 2021-03-04 ENCOUNTER — Ambulatory Visit: Payer: Medicare HMO | Admitting: Dermatology

## 2021-08-19 ENCOUNTER — Other Ambulatory Visit: Payer: Self-pay | Admitting: Internal Medicine

## 2021-08-19 DIAGNOSIS — Z1231 Encounter for screening mammogram for malignant neoplasm of breast: Secondary | ICD-10-CM

## 2021-09-24 ENCOUNTER — Ambulatory Visit
Admission: RE | Admit: 2021-09-24 | Discharge: 2021-09-24 | Disposition: A | Discharge status: Home / Self Care | Payer: Medicare HMO | Source: Ambulatory Visit | Attending: Internal Medicine | Admitting: Internal Medicine

## 2021-09-24 ENCOUNTER — Other Ambulatory Visit: Payer: Self-pay

## 2021-09-24 DIAGNOSIS — Z1231 Encounter for screening mammogram for malignant neoplasm of breast: Secondary | ICD-10-CM | POA: Diagnosis present

## 2022-08-26 ENCOUNTER — Other Ambulatory Visit: Payer: Self-pay | Admitting: Internal Medicine

## 2022-08-26 DIAGNOSIS — Z1231 Encounter for screening mammogram for malignant neoplasm of breast: Secondary | ICD-10-CM

## 2022-09-28 ENCOUNTER — Ambulatory Visit
Admission: RE | Admit: 2022-09-28 | Discharge: 2022-09-28 | Disposition: A | Discharge status: Home / Self Care | Payer: Medicare HMO | Source: Ambulatory Visit | Attending: Internal Medicine | Admitting: Internal Medicine

## 2022-09-28 DIAGNOSIS — Z1231 Encounter for screening mammogram for malignant neoplasm of breast: Secondary | ICD-10-CM | POA: Insufficient documentation

## 2023-08-18 ENCOUNTER — Other Ambulatory Visit: Payer: Self-pay | Admitting: Internal Medicine

## 2023-08-18 DIAGNOSIS — Z1231 Encounter for screening mammogram for malignant neoplasm of breast: Secondary | ICD-10-CM

## 2023-10-18 ENCOUNTER — Ambulatory Visit
Admission: RE | Admit: 2023-10-18 | Discharge: 2023-10-18 | Disposition: A | Discharge status: Home / Self Care | Source: Ambulatory Visit | Attending: Internal Medicine | Admitting: Internal Medicine

## 2023-10-18 DIAGNOSIS — Z1231 Encounter for screening mammogram for malignant neoplasm of breast: Secondary | ICD-10-CM | POA: Diagnosis present
